# Patient Record
Sex: Female | Born: 1935
Health system: Southern US, Community
[De-identification: ages and names within clinical notes are randomized; demographics above are authoritative.]

---

## 2004-09-09 ENCOUNTER — Other Ambulatory Visit: Payer: Self-pay

## 2004-09-26 ENCOUNTER — Inpatient Hospital Stay: Payer: Self-pay | Admitting: Specialist

## 2011-09-14 ENCOUNTER — Emergency Department: Payer: Self-pay | Admitting: Internal Medicine

## 2011-09-16 ENCOUNTER — Inpatient Hospital Stay: Payer: Self-pay | Admitting: Internal Medicine

## 2012-11-28 ENCOUNTER — Inpatient Hospital Stay: Payer: Self-pay | Admitting: Internal Medicine

## 2012-11-28 LAB — COMPREHENSIVE METABOLIC PANEL
Albumin: 3.9 g/dL (ref 3.4–5.0)
Alkaline Phosphatase: 100 U/L (ref 50–136)
Anion Gap: 9 (ref 7–16)
BUN: 24 mg/dL — ABNORMAL HIGH (ref 7–18)
Bilirubin,Total: 0.9 mg/dL (ref 0.2–1.0)
Calcium, Total: 10.2 mg/dL — ABNORMAL HIGH (ref 8.5–10.1)
Creatinine: 1.18 mg/dL (ref 0.60–1.30)
EGFR (African American): 45 — ABNORMAL LOW
EGFR (Non-African Amer.): 39 — ABNORMAL LOW
Osmolality: 288 (ref 275–301)
Potassium: 4 mmol/L (ref 3.5–5.1)
SGOT(AST): 37 U/L (ref 15–37)
SGPT (ALT): 22 U/L (ref 12–78)
Sodium: 142 mmol/L (ref 136–145)
Total Protein: 7.4 g/dL (ref 6.4–8.2)

## 2012-11-28 LAB — CBC
HCT: 39.9 % (ref 35.0–47.0)
HGB: 13.5 g/dL (ref 12.0–16.0)
Platelet: 238 10*3/uL (ref 150–440)
RBC: 4.49 10*6/uL (ref 3.80–5.20)
RDW: 14.2 % (ref 11.5–14.5)
WBC: 10.4 10*3/uL (ref 3.6–11.0)

## 2012-11-28 LAB — URINALYSIS, COMPLETE
Bilirubin,UR: NEGATIVE
Blood: NEGATIVE
Specific Gravity: 1.013 (ref 1.003–1.030)
Transitional Epi: 10
WBC UR: 500 /HPF (ref 0–5)

## 2012-11-28 LAB — PROTIME-INR: INR: 1

## 2012-11-28 LAB — CK TOTAL AND CKMB (NOT AT ARMC)
CK, Total: 244 U/L — ABNORMAL HIGH (ref 21–215)
CK-MB: 4.6 ng/mL — ABNORMAL HIGH (ref 0.5–3.6)

## 2012-11-28 LAB — TROPONIN I: Troponin-I: <0.02 ng/mL

## 2012-11-28 LAB — APTT: Activated PTT: 30.2 secs (ref 23.6–35.9)

## 2012-11-29 LAB — BASIC METABOLIC PANEL WITH GFR
Anion Gap: 5 — ABNORMAL LOW (ref 7–16)
BUN: 24 mg/dL — ABNORMAL HIGH (ref 7–18)
Calcium, Total: 8.6 mg/dL (ref 8.5–10.1)
Chloride: 110 mmol/L — ABNORMAL HIGH (ref 98–107)
Co2: 28 mmol/L (ref 21–32)
Creatinine: 1.17 mg/dL (ref 0.60–1.30)
EGFR (African American): 46 — ABNORMAL LOW
EGFR (Non-African Amer.): 39 — ABNORMAL LOW
Glucose: 92 mg/dL (ref 65–99)
Osmolality: 289 (ref 275–301)
Potassium: 3.4 mmol/L — ABNORMAL LOW (ref 3.5–5.1)
Sodium: 143 mmol/L (ref 136–145)

## 2012-11-29 LAB — CBC WITH DIFFERENTIAL/PLATELET
Basophil #: 0 10*3/uL (ref 0.0–0.1)
Basophil %: 0.4 %
HCT: 33.6 % — ABNORMAL LOW (ref 35.0–47.0)
HGB: 11.9 g/dL — ABNORMAL LOW (ref 12.0–16.0)
Lymphocyte #: 0.6 10*3/uL — ABNORMAL LOW (ref 1.0–3.6)
Lymphocyte %: 5.9 %
MCH: 31.2 pg (ref 26.0–34.0)
MCHC: 35.3 g/dL (ref 32.0–36.0)
Monocyte #: 0.4 x10 3/mm (ref 0.2–0.9)
Neutrophil %: 87.8 %
Platelet: 204 10*3/uL (ref 150–440)
RBC: 3.8 10*6/uL (ref 3.80–5.20)
RDW: 14.1 % (ref 11.5–14.5)
WBC: 9.5 10*3/uL (ref 3.6–11.0)

## 2012-11-29 LAB — URINE CULTURE

## 2012-11-30 DIAGNOSIS — R9431 Abnormal electrocardiogram [ECG] [EKG]: Secondary | ICD-10-CM

## 2012-11-30 LAB — BASIC METABOLIC PANEL
BUN: 22 mg/dL — ABNORMAL HIGH (ref 7–18)
Chloride: 111 mmol/L — ABNORMAL HIGH (ref 98–107)
Creatinine: 1.06 mg/dL (ref 0.60–1.30)
EGFR (African American): 51 — ABNORMAL LOW
EGFR (Non-African Amer.): 44 — ABNORMAL LOW
Glucose: 78 mg/dL (ref 65–99)
Osmolality: 287 (ref 275–301)
Sodium: 143 mmol/L (ref 136–145)

## 2012-11-30 LAB — MAGNESIUM: Magnesium: 1.4 mg/dL — ABNORMAL LOW

## 2013-08-06 ENCOUNTER — Emergency Department: Payer: Self-pay | Admitting: Emergency Medicine

## 2013-08-06 LAB — COMPREHENSIVE METABOLIC PANEL
Albumin: 3.7 g/dL (ref 3.4–5.0)
Alkaline Phosphatase: 109 U/L (ref 50–136)
Bilirubin,Total: 0.8 mg/dL (ref 0.2–1.0)
Calcium, Total: 9.4 mg/dL (ref 8.5–10.1)
Chloride: 106 mmol/L (ref 98–107)
Co2: 31 mmol/L (ref 21–32)
Creatinine: 1.21 mg/dL (ref 0.60–1.30)
EGFR (African American): 43 — ABNORMAL LOW
EGFR (Non-African Amer.): 37 — ABNORMAL LOW
Osmolality: 292 (ref 275–301)
SGOT(AST): 27 U/L (ref 15–37)
SGPT (ALT): 18 U/L (ref 12–78)
Total Protein: 7.2 g/dL (ref 6.4–8.2)

## 2013-08-06 LAB — CBC
HCT: 40.1 % (ref 35.0–47.0)
HGB: 13.7 g/dL (ref 12.0–16.0)
MCH: 30.6 pg (ref 26.0–34.0)
RDW: 14.1 % (ref 11.5–14.5)

## 2013-08-06 LAB — URINALYSIS, COMPLETE
Bacteria: NONE SEEN
Bilirubin,UR: NEGATIVE
Blood: NEGATIVE
RBC,UR: 4 /HPF (ref 0–5)
Squamous Epithelial: NONE SEEN
WBC UR: 1 /HPF (ref 0–5)

## 2013-08-06 LAB — TROPONIN I: Troponin-I: <0.02 ng/mL

## 2013-08-07 LAB — URINE CULTURE

## 2013-08-14 ENCOUNTER — Observation Stay: Payer: Self-pay | Admitting: Internal Medicine

## 2013-08-14 LAB — CBC WITH DIFFERENTIAL/PLATELET
Basophil #: 0.1 10*3/uL (ref 0.0–0.1)
Basophil %: 1.2 %
Eosinophil #: 0.2 10*3/uL (ref 0.0–0.7)
HCT: 39.1 % (ref 35.0–47.0)
HGB: 13.2 g/dL (ref 12.0–16.0)
Lymphocyte #: 1.2 10*3/uL (ref 1.0–3.6)
MCH: 30.5 pg (ref 26.0–34.0)
MCHC: 33.9 g/dL (ref 32.0–36.0)
MCV: 90 fL (ref 80–100)
Monocyte #: 0.5 x10 3/mm (ref 0.2–0.9)
Monocyte %: 6 %
Platelet: 193 10*3/uL (ref 150–440)
RBC: 4.33 10*6/uL (ref 3.80–5.20)
RDW: 14 % (ref 11.5–14.5)

## 2013-08-14 LAB — COMPREHENSIVE METABOLIC PANEL
Albumin: 3.5 g/dL (ref 3.4–5.0)
Alkaline Phosphatase: 86 U/L
Anion Gap: 3 — ABNORMAL LOW (ref 7–16)
BUN: 28 mg/dL — ABNORMAL HIGH (ref 7–18)
Calcium, Total: 9.9 mg/dL (ref 8.5–10.1)
Co2: 33 mmol/L — ABNORMAL HIGH (ref 21–32)
Creatinine: 1.2 mg/dL (ref 0.60–1.30)
Glucose: 134 mg/dL — ABNORMAL HIGH (ref 65–99)
Osmolality: 285 (ref 275–301)
Potassium: 3.9 mmol/L (ref 3.5–5.1)
SGOT(AST): 21 U/L (ref 15–37)
Sodium: 139 mmol/L (ref 136–145)

## 2013-08-14 LAB — URINALYSIS, COMPLETE
Bilirubin,UR: NEGATIVE
Blood: NEGATIVE
Glucose,UR: NEGATIVE mg/dL (ref 0–75)
Leukocyte Esterase: NEGATIVE
Ph: 7 (ref 4.5–8.0)
Protein: NEGATIVE
RBC,UR: <1 /HPF (ref 0–5)
Specific Gravity: 1.011 (ref 1.003–1.030)

## 2013-08-14 LAB — TSH: Thyroid Stimulating Horm: 2.69 u[IU]/mL

## 2013-08-14 LAB — TROPONIN I: Troponin-I: <0.02 ng/mL

## 2013-08-15 LAB — LIPID PANEL
Cholesterol: 111 mg/dL (ref 0–200)
Triglycerides: 110 mg/dL (ref 0–200)
VLDL Cholesterol, Calc: 22 mg/dL (ref 5–40)

## 2013-09-10 LAB — BASIC METABOLIC PANEL
Anion Gap: 4 — ABNORMAL LOW (ref 7–16)
BUN: 23 mg/dL — ABNORMAL HIGH (ref 7–18)
Calcium, Total: 10.4 mg/dL — ABNORMAL HIGH (ref 8.5–10.1)
Co2: 33 mmol/L — ABNORMAL HIGH (ref 21–32)
Creatinine: 1.17 mg/dL (ref 0.60–1.30)
EGFR (Non-African Amer.): 39 — ABNORMAL LOW
Glucose: 149 mg/dL — ABNORMAL HIGH (ref 65–99)
Osmolality: 284 (ref 275–301)
Potassium: 4.2 mmol/L (ref 3.5–5.1)
Sodium: 139 mmol/L (ref 136–145)

## 2013-09-10 LAB — CBC WITH DIFFERENTIAL/PLATELET
Basophil #: 0.1 10*3/uL (ref 0.0–0.1)
Basophil %: 0.9 %
Eosinophil %: 2.4 %
HCT: 42.6 % (ref 35.0–47.0)
HGB: 14.1 g/dL (ref 12.0–16.0)
Lymphocyte #: 1.4 10*3/uL (ref 1.0–3.6)
Lymphocyte %: 15.9 %
MCH: 30.2 pg (ref 26.0–34.0)
MCHC: 33.2 g/dL (ref 32.0–36.0)
Neutrophil %: 74 %
Platelet: 257 10*3/uL (ref 150–440)
RDW: 14 % (ref 11.5–14.5)
WBC: 8.7 10*3/uL (ref 3.6–11.0)

## 2013-09-10 LAB — TROPONIN I: Troponin-I: 0.03 ng/mL

## 2013-09-10 LAB — URINALYSIS, COMPLETE
Bacteria: NONE SEEN
Blood: NEGATIVE
Glucose,UR: NEGATIVE mg/dL (ref 0–75)
Ketone: NEGATIVE
Nitrite: NEGATIVE
Protein: NEGATIVE

## 2013-09-11 ENCOUNTER — Observation Stay: Payer: Self-pay | Admitting: Internal Medicine

## 2013-09-12 LAB — CBC WITH DIFFERENTIAL/PLATELET
Basophil #: 0.1 10*3/uL (ref 0.0–0.1)
Basophil %: 0.7 %
Eosinophil #: 0.3 10*3/uL (ref 0.0–0.7)
Eosinophil %: 3.6 %
HCT: 37 % (ref 35.0–47.0)
Lymphocyte #: 0.9 10*3/uL — ABNORMAL LOW (ref 1.0–3.6)
MCH: 29.2 pg (ref 26.0–34.0)
MCHC: 32 g/dL (ref 32.0–36.0)
MCV: 91 fL (ref 80–100)
Monocyte %: 3.6 %
Platelet: 208 10*3/uL (ref 150–440)
RBC: 4.06 10*6/uL (ref 3.80–5.20)
WBC: 7.6 10*3/uL (ref 3.6–11.0)

## 2013-09-12 LAB — BASIC METABOLIC PANEL
Anion Gap: 5 — ABNORMAL LOW (ref 7–16)
Creatinine: 1.11 mg/dL (ref 0.60–1.30)
EGFR (Non-African Amer.): 42 — ABNORMAL LOW
Osmolality: 288 (ref 275–301)
Sodium: 143 mmol/L (ref 136–145)

## 2013-09-22 ENCOUNTER — Ambulatory Visit: Payer: Self-pay | Admitting: Internal Medicine

## 2013-09-22 ENCOUNTER — Emergency Department: Payer: Self-pay | Admitting: Emergency Medicine

## 2013-09-22 LAB — URINALYSIS, COMPLETE
BILIRUBIN, UR: NEGATIVE
BLOOD: NEGATIVE
Glucose,UR: NEGATIVE mg/dL (ref 0–75)
Hyaline Cast: 10
KETONE: NEGATIVE
Nitrite: NEGATIVE
PH: 6 (ref 4.5–8.0)
PROTEIN: NEGATIVE
RBC,UR: 1 /HPF (ref 0–5)
SPECIFIC GRAVITY: 1.015 (ref 1.003–1.030)
Squamous Epithelial: NONE SEEN
WBC UR: 18 /HPF (ref 0–5)

## 2013-09-22 LAB — CBC
HCT: 40.6 % (ref 35.0–47.0)
HGB: 13.7 g/dL (ref 12.0–16.0)
MCH: 30.5 pg (ref 26.0–34.0)
MCHC: 33.8 g/dL (ref 32.0–36.0)
MCV: 90 fL (ref 80–100)
Platelet: 238 10*3/uL (ref 150–440)
RBC: 4.5 10*6/uL (ref 3.80–5.20)
RDW: 13.5 % (ref 11.5–14.5)
WBC: 10.9 10*3/uL (ref 3.6–11.0)

## 2013-09-22 LAB — COMPREHENSIVE METABOLIC PANEL
Albumin: 3.5 g/dL (ref 3.4–5.0)
Alkaline Phosphatase: 89 U/L
Anion Gap: 2 — ABNORMAL LOW (ref 7–16)
BILIRUBIN TOTAL: 0.7 mg/dL (ref 0.2–1.0)
BUN: 16 mg/dL (ref 7–18)
Calcium, Total: 9.4 mg/dL (ref 8.5–10.1)
Chloride: 104 mmol/L (ref 98–107)
Co2: 31 mmol/L (ref 21–32)
Creatinine: 1.06 mg/dL (ref 0.60–1.30)
EGFR (African American): 51 — ABNORMAL LOW
GFR CALC NON AF AMER: 44 — AB
Glucose: 97 mg/dL (ref 65–99)
Osmolality: 275 (ref 275–301)
Potassium: 3.8 mmol/L (ref 3.5–5.1)
SGOT(AST): 26 U/L (ref 15–37)
SGPT (ALT): 20 U/L (ref 12–78)
SODIUM: 137 mmol/L (ref 136–145)
TOTAL PROTEIN: 7.1 g/dL (ref 6.4–8.2)

## 2013-09-24 LAB — URINE CULTURE

## 2013-09-27 ENCOUNTER — Inpatient Hospital Stay: Payer: Self-pay | Admitting: Internal Medicine

## 2013-09-27 LAB — CBC WITH DIFFERENTIAL/PLATELET
Basophil #: 0 10*3/uL (ref 0.0–0.1)
Basophil %: 0.3 %
Eosinophil #: 0.2 10*3/uL (ref 0.0–0.7)
Eosinophil %: 1.3 %
HCT: 39.6 % (ref 35.0–47.0)
HGB: 13.3 g/dL (ref 12.0–16.0)
LYMPHS ABS: 0.5 10*3/uL — AB (ref 1.0–3.6)
LYMPHS PCT: 4 %
MCH: 30.2 pg (ref 26.0–34.0)
MCHC: 33.7 g/dL (ref 32.0–36.0)
MCV: 90 fL (ref 80–100)
Monocyte #: 0.7 x10 3/mm (ref 0.2–0.9)
Monocyte %: 5.8 %
Neutrophil #: 10.5 10*3/uL — ABNORMAL HIGH (ref 1.4–6.5)
Neutrophil %: 88.6 %
Platelet: 220 10*3/uL (ref 150–440)
RBC: 4.42 10*6/uL (ref 3.80–5.20)
RDW: 13.5 % (ref 11.5–14.5)
WBC: 11.8 10*3/uL — ABNORMAL HIGH (ref 3.6–11.0)

## 2013-09-27 LAB — COMPREHENSIVE METABOLIC PANEL
ALT: 28 U/L (ref 12–78)
Albumin: 3.3 g/dL — ABNORMAL LOW (ref 3.4–5.0)
Alkaline Phosphatase: 106 U/L
Anion Gap: 5 — ABNORMAL LOW (ref 7–16)
BUN: 21 mg/dL — AB (ref 7–18)
Bilirubin,Total: 1 mg/dL (ref 0.2–1.0)
CO2: 27 mmol/L (ref 21–32)
Calcium, Total: 9.3 mg/dL (ref 8.5–10.1)
Chloride: 104 mmol/L (ref 98–107)
Creatinine: 1.31 mg/dL — ABNORMAL HIGH (ref 0.60–1.30)
EGFR (African American): 39 — ABNORMAL LOW
EGFR (Non-African Amer.): 34 — ABNORMAL LOW
GLUCOSE: 117 mg/dL — AB (ref 65–99)
Osmolality: 276 (ref 275–301)
Potassium: 4.1 mmol/L (ref 3.5–5.1)
SGOT(AST): 65 U/L — ABNORMAL HIGH (ref 15–37)
SODIUM: 136 mmol/L (ref 136–145)
Total Protein: 6.6 g/dL (ref 6.4–8.2)

## 2013-09-27 LAB — URINALYSIS, COMPLETE
BILIRUBIN, UR: NEGATIVE
Bacteria: NONE SEEN
Glucose,UR: NEGATIVE mg/dL (ref 0–75)
NITRITE: NEGATIVE
PROTEIN: NEGATIVE
Ph: 6 (ref 4.5–8.0)
SPECIFIC GRAVITY: 1.01 (ref 1.003–1.030)

## 2013-09-27 LAB — MAGNESIUM: Magnesium: 1.7 mg/dL — ABNORMAL LOW

## 2013-09-27 LAB — TROPONIN I: Troponin-I: <0.02 ng/mL

## 2013-09-27 LAB — LIPASE, BLOOD: Lipase: 79 U/L (ref 73–393)

## 2013-09-28 LAB — CBC WITH DIFFERENTIAL/PLATELET
BASOS PCT: 1.2 %
Basophil #: 0.1 10*3/uL (ref 0.0–0.1)
EOS ABS: 0.4 10*3/uL (ref 0.0–0.7)
EOS PCT: 4.7 %
HCT: 33.8 % — ABNORMAL LOW (ref 35.0–47.0)
HGB: 11.7 g/dL — ABNORMAL LOW (ref 12.0–16.0)
Lymphocyte #: 0.6 10*3/uL — ABNORMAL LOW (ref 1.0–3.6)
Lymphocyte %: 7.4 %
MCH: 31 pg (ref 26.0–34.0)
MCHC: 34.6 g/dL (ref 32.0–36.0)
MCV: 90 fL (ref 80–100)
MONO ABS: 0.3 x10 3/mm (ref 0.2–0.9)
MONOS PCT: 3.5 %
Neutrophil #: 7.2 10*3/uL — ABNORMAL HIGH (ref 1.4–6.5)
Neutrophil %: 83.2 %
Platelet: 232 10*3/uL (ref 150–440)
RBC: 3.77 10*6/uL — AB (ref 3.80–5.20)
RDW: 13.7 % (ref 11.5–14.5)
WBC: 8.7 10*3/uL (ref 3.6–11.0)

## 2013-09-28 LAB — BASIC METABOLIC PANEL
Anion Gap: 6 — ABNORMAL LOW (ref 7–16)
BUN: 15 mg/dL (ref 7–18)
CHLORIDE: 110 mmol/L — AB (ref 98–107)
CREATININE: 1.17 mg/dL (ref 0.60–1.30)
Calcium, Total: 8.8 mg/dL (ref 8.5–10.1)
Co2: 26 mmol/L (ref 21–32)
GFR CALC AF AMER: 45 — AB
GFR CALC NON AF AMER: 39 — AB
Glucose: 85 mg/dL (ref 65–99)
OSMOLALITY: 283 (ref 275–301)
Potassium: 3.6 mmol/L (ref 3.5–5.1)
Sodium: 142 mmol/L (ref 136–145)

## 2013-09-28 LAB — MAGNESIUM: Magnesium: 1.7 mg/dL — ABNORMAL LOW

## 2013-09-29 LAB — URINE CULTURE

## 2013-09-30 LAB — BASIC METABOLIC PANEL
Anion Gap: 7 (ref 7–16)
BUN: 12 mg/dL (ref 7–18)
CALCIUM: 8.2 mg/dL — AB (ref 8.5–10.1)
CHLORIDE: 108 mmol/L — AB (ref 98–107)
CREATININE: 1.15 mg/dL (ref 0.60–1.30)
Co2: 26 mmol/L (ref 21–32)
EGFR (African American): 46 — ABNORMAL LOW
GFR CALC NON AF AMER: 40 — AB
Glucose: 79 mg/dL (ref 65–99)
OSMOLALITY: 280 (ref 275–301)
POTASSIUM: 3.3 mmol/L — AB (ref 3.5–5.1)
SODIUM: 141 mmol/L (ref 136–145)

## 2013-09-30 LAB — MAGNESIUM: Magnesium: 1.4 mg/dL — ABNORMAL LOW

## 2013-10-01 ENCOUNTER — Ambulatory Visit: Payer: Self-pay | Admitting: Neurology

## 2013-10-01 LAB — BASIC METABOLIC PANEL
Anion Gap: 8 (ref 7–16)
BUN: 10 mg/dL (ref 7–18)
CO2: 26 mmol/L (ref 21–32)
Calcium, Total: 8.6 mg/dL (ref 8.5–10.1)
Chloride: 107 mmol/L (ref 98–107)
Creatinine: 1.02 mg/dL (ref 0.60–1.30)
EGFR (Non-African Amer.): 46 — ABNORMAL LOW
GFR CALC AF AMER: 53 — AB
GLUCOSE: 88 mg/dL (ref 65–99)
Osmolality: 280 (ref 275–301)
Potassium: 3.2 mmol/L — ABNORMAL LOW (ref 3.5–5.1)
Sodium: 141 mmol/L (ref 136–145)

## 2013-10-01 LAB — PLATELET COUNT: Platelet: 256 10*3/uL (ref 150–440)

## 2013-10-02 LAB — CULTURE, BLOOD (SINGLE)

## 2013-10-05 ENCOUNTER — Emergency Department: Payer: Self-pay | Admitting: Emergency Medicine

## 2013-10-05 LAB — CBC
HCT: 32.5 % — ABNORMAL LOW (ref 35.0–47.0)
HGB: 11 g/dL — AB (ref 12.0–16.0)
MCH: 30.3 pg (ref 26.0–34.0)
MCHC: 34 g/dL (ref 32.0–36.0)
MCV: 89 fL (ref 80–100)
Platelet: 221 10*3/uL (ref 150–440)
RBC: 3.65 10*6/uL — ABNORMAL LOW (ref 3.80–5.20)
RDW: 13.6 % (ref 11.5–14.5)
WBC: 11.6 10*3/uL — ABNORMAL HIGH (ref 3.6–11.0)

## 2013-10-05 LAB — URINALYSIS, COMPLETE
BILIRUBIN, UR: NEGATIVE
Bacteria: NONE SEEN
Blood: NEGATIVE
Glucose,UR: NEGATIVE mg/dL (ref 0–75)
Ketone: NEGATIVE
Nitrite: NEGATIVE
Ph: 6 (ref 4.5–8.0)
Protein: NEGATIVE
RBC,UR: 3 /HPF (ref 0–5)
Specific Gravity: 1.014 (ref 1.003–1.030)
Squamous Epithelial: NONE SEEN
WBC UR: 9 /HPF (ref 0–5)

## 2013-10-05 LAB — COMPREHENSIVE METABOLIC PANEL
Albumin: 2.6 g/dL — ABNORMAL LOW (ref 3.4–5.0)
Alkaline Phosphatase: 79 U/L
Anion Gap: 4 — ABNORMAL LOW (ref 7–16)
BUN: 19 mg/dL — ABNORMAL HIGH (ref 7–18)
Bilirubin,Total: 0.5 mg/dL (ref 0.2–1.0)
CHLORIDE: 106 mmol/L (ref 98–107)
Calcium, Total: 8.5 mg/dL (ref 8.5–10.1)
Co2: 29 mmol/L (ref 21–32)
Creatinine: 1.07 mg/dL (ref 0.60–1.30)
EGFR (African American): 50 — ABNORMAL LOW
EGFR (Non-African Amer.): 43 — ABNORMAL LOW
Glucose: 119 mg/dL — ABNORMAL HIGH (ref 65–99)
Osmolality: 281 (ref 275–301)
POTASSIUM: 3.3 mmol/L — AB (ref 3.5–5.1)
SGOT(AST): 27 U/L (ref 15–37)
SGPT (ALT): 18 U/L (ref 12–78)
Sodium: 139 mmol/L (ref 136–145)
Total Protein: 5.9 g/dL — ABNORMAL LOW (ref 6.4–8.2)

## 2013-10-05 LAB — TROPONIN I: Troponin-I: <0.02 ng/mL

## 2013-10-23 ENCOUNTER — Ambulatory Visit: Payer: Self-pay | Admitting: Internal Medicine

## 2013-10-23 DEATH — deceased

## 2014-01-01 IMAGING — CT CT HEAD WITHOUT CONTRAST
2 of 4 series · 15 of 30 positions shown, 17 images · non-contrast
Comparison: 08/14/2013 and earlier studies

CLINICAL DATA: Confusion

EXAM:
CT HEAD WITHOUT CONTRAST
TECHNIQUE: Contiguous axial images were obtained from the base of the skull
through the vertex without intravenous contrast.

[Series 2: soft tissue · axial · 0.50mm/px · z∈[-139,-24]mm · 7 of 31 slices shown]
[im 4/31  brain]
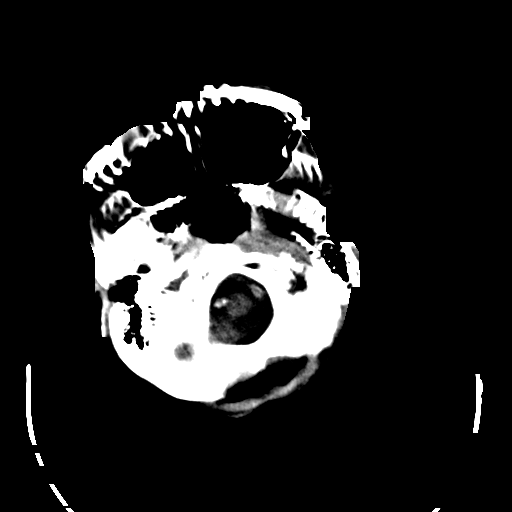
[im 8/31  brain]
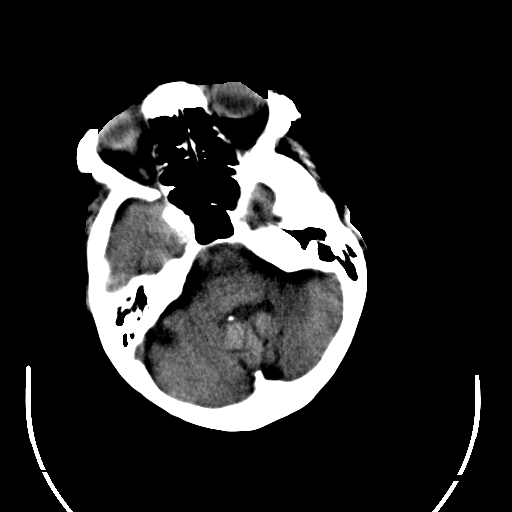
[im 12/31  brain]
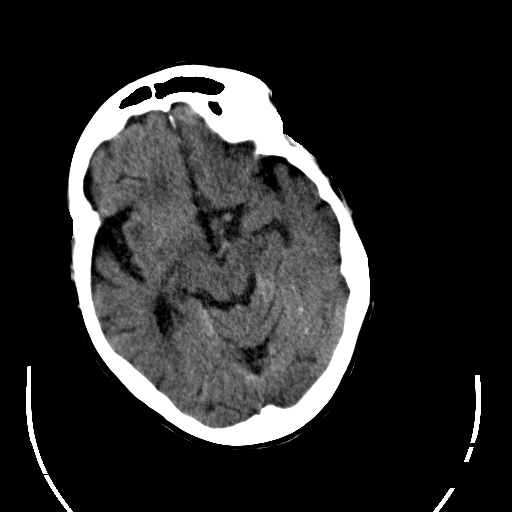
[im 16/31  brain]
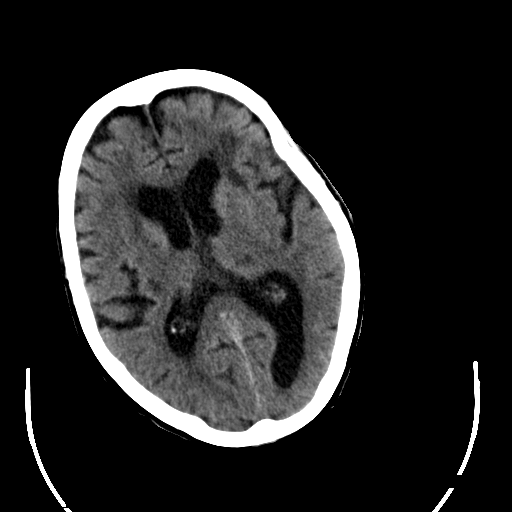
[im 19/31  brain]
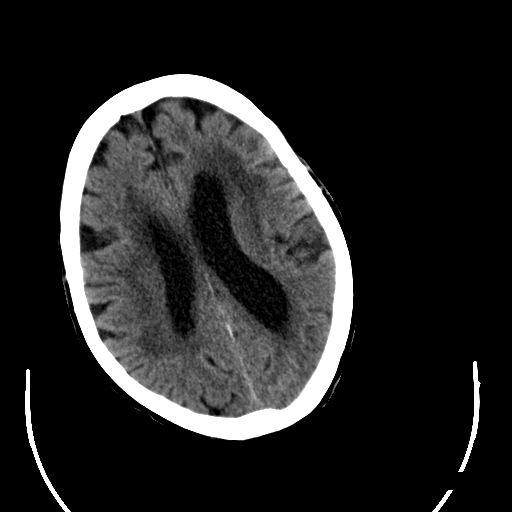
[im 23/31  brain]
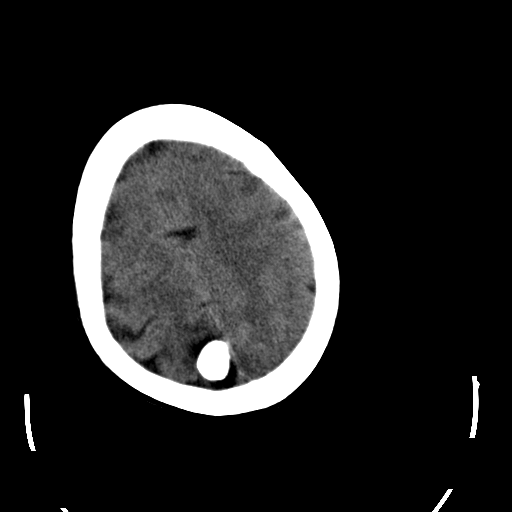
[im 27/31  brain]
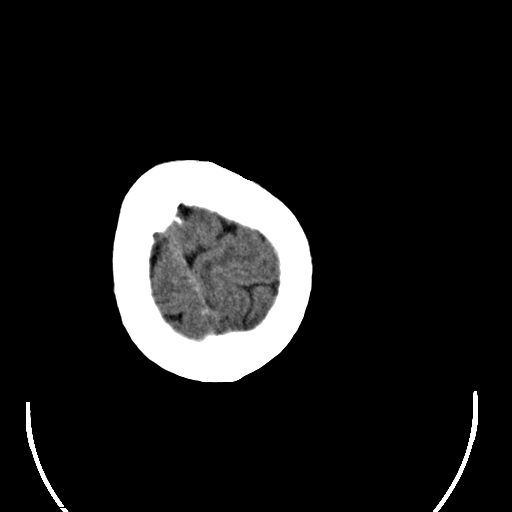

[Series 8: soft tissue recon · axial · 0.46mm/px · z∈[-153,-39]mm · 8 of 31 slices shown, 10 images]
[im 4/31  brain]
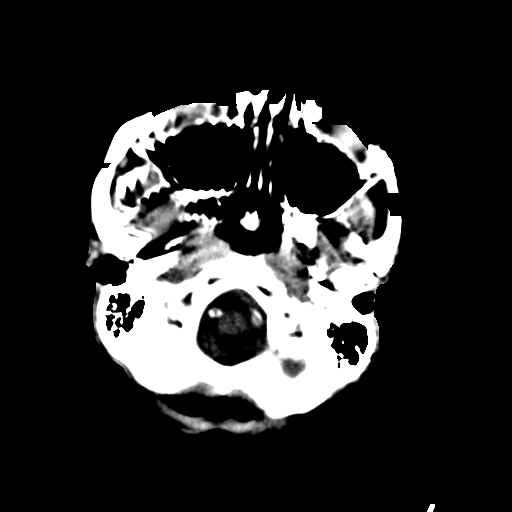
[im 4/31  bone]
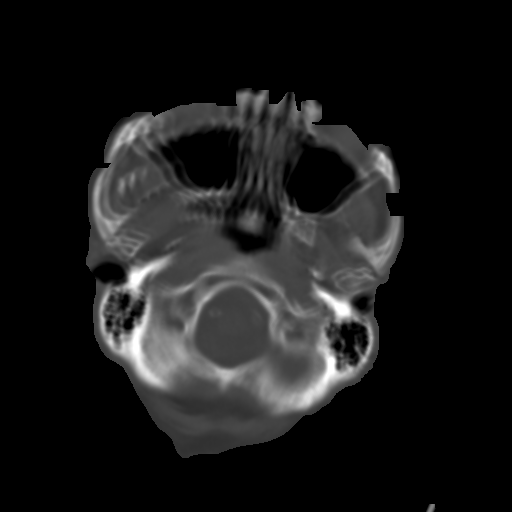
[im 7/31  brain]
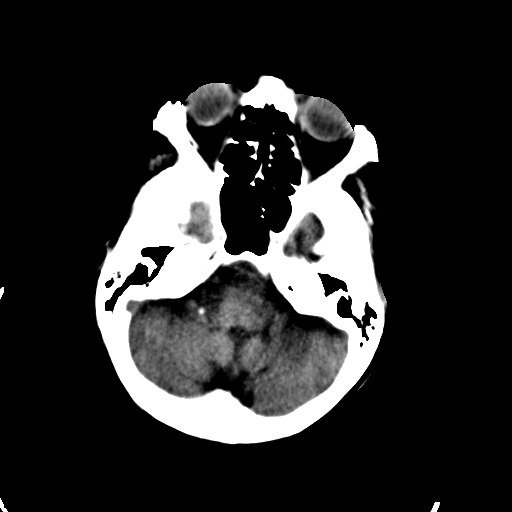
[im 11/31  brain]
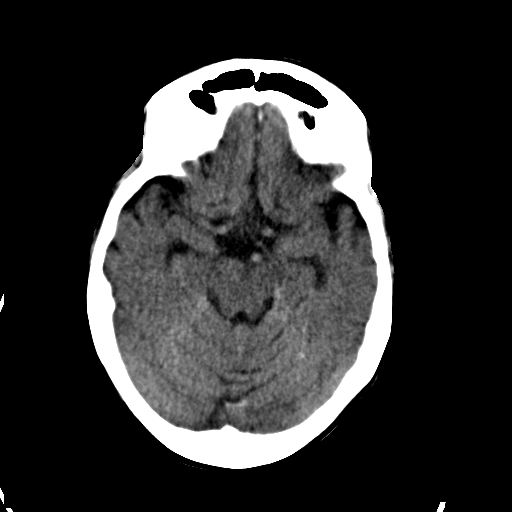
[im 14/31  brain]
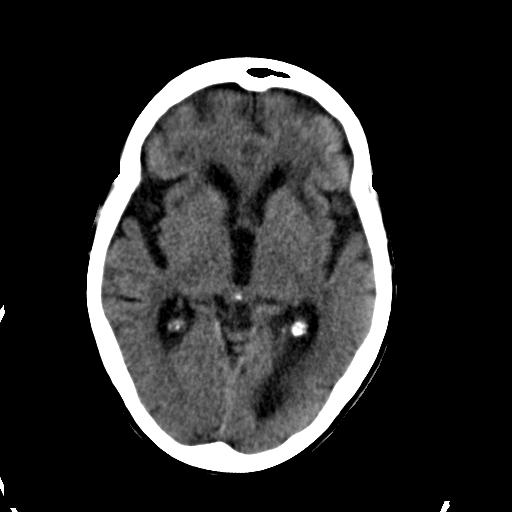
[im 17/31  brain]
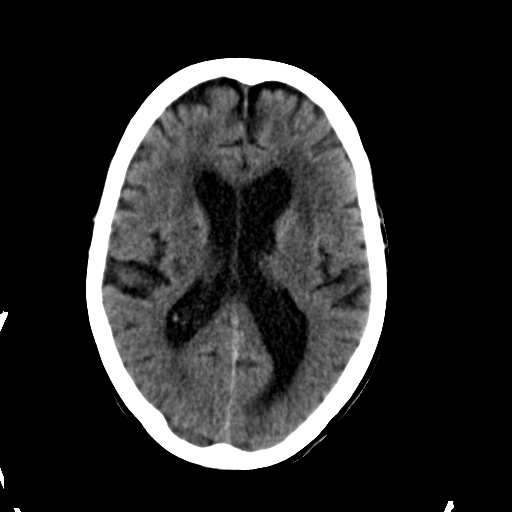
[im 17/31  bone]
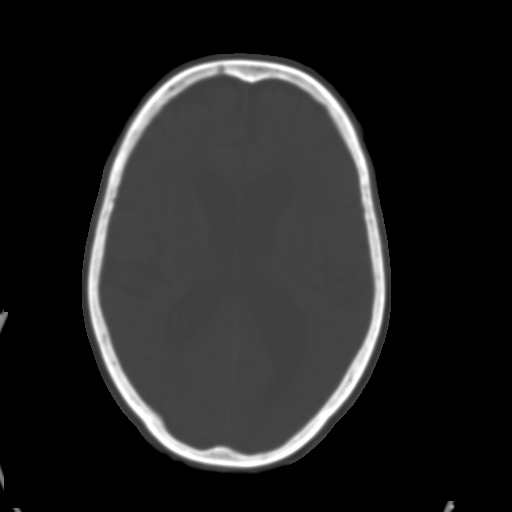
[im 21/31  brain]
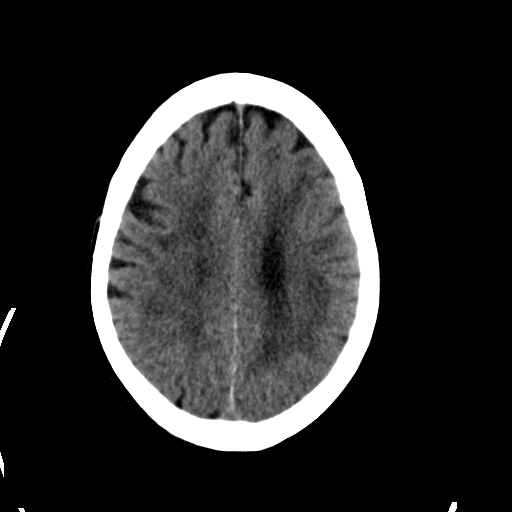
[im 24/31  brain]
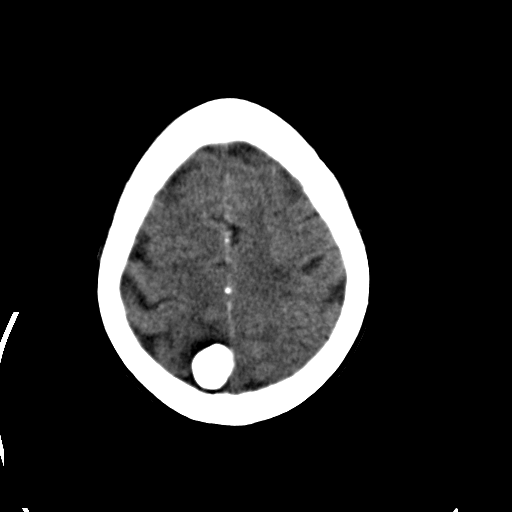
[im 27/31  brain]
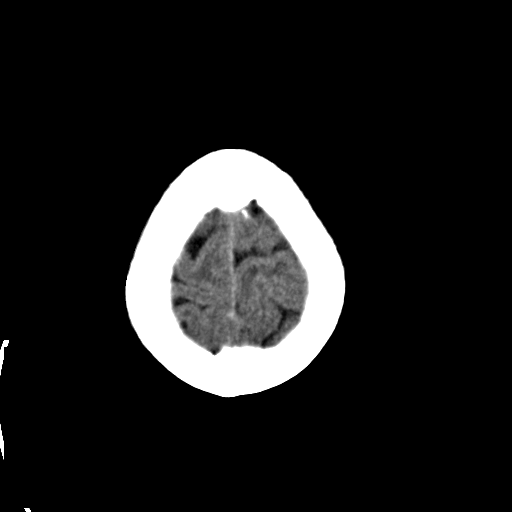

[15 of 30 positions shown; findings below may reference images not displayed]

FINDINGS: Stable 2 cm calcified right posterior parietal extra-axial mass. No
significant adjacent parenchymal edema. Atherosclerotic and
physiologic intracranial calcifications. Diffuse parenchymal
atrophy. Patchy areas of hypoattenuation in deep and periventricular
white matter bilaterally. Negative for acute intracranial
hemorrhage, intra-axial mass lesion, acute infarction, midline
shift, or mass-effect. Acute infarct may be inapparent on
noncontrast CT. Ventricles and sulci symmetric. Bone windows
demonstrate no acute lesion.
IMPRESSION: 1. Negative for bleed or other acute intracranial process.
2. Atrophy and bilateral extensive nonspecific white matter changes
as before.
3. Probable right posterior parietal meningioma, stable.

## 2014-01-17 IMAGING — CR DG CHEST 1V PORT
1 series · 1 of 1 positions shown · non-contrast
Comparison: PA and lateral chest 08/14/2013 and single view of the
chest 08/06/2013.

CLINICAL DATA: Cough.  Altered mental status.

EXAM:
PORTABLE CHEST - 1 VIEW

[ap]
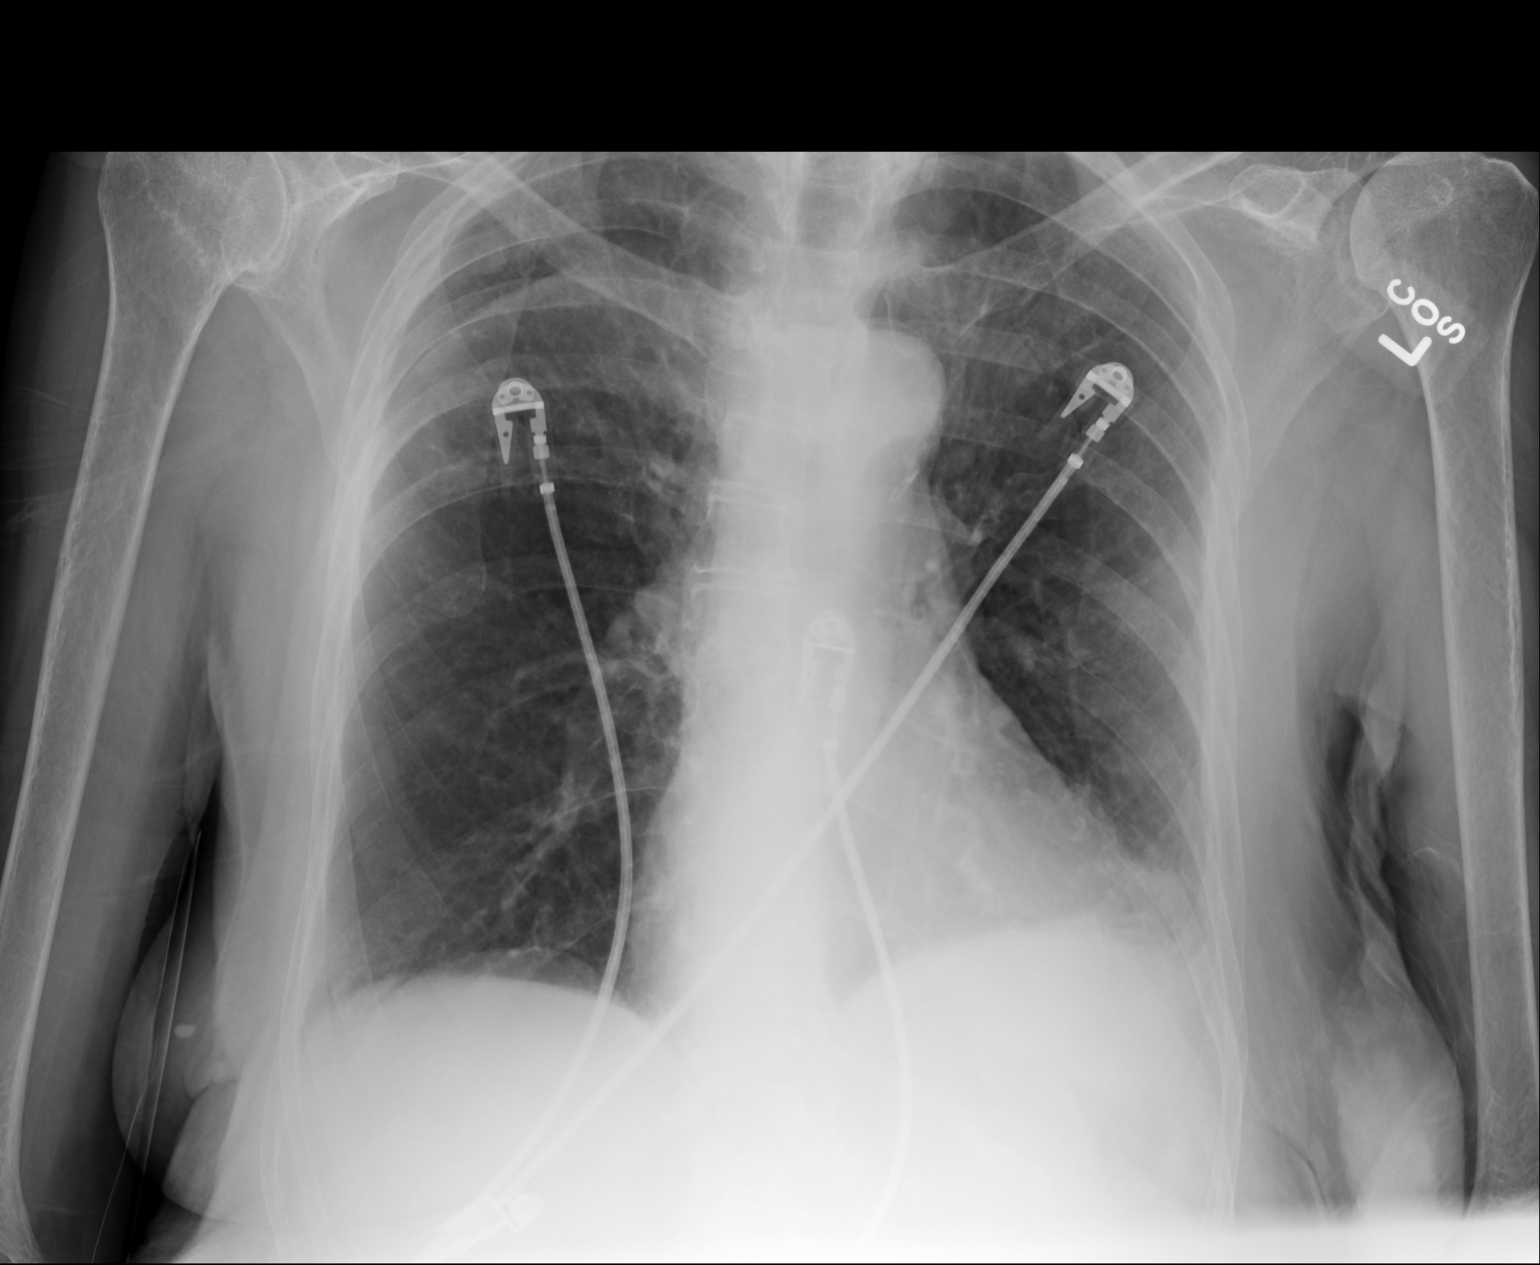

[1 of 1 positions shown; findings below may reference images not displayed]

FINDINGS: Patchy left basilar airspace disease is identified. The right lung
is clear. Heart size is normal. No pneumothorax or pleural fluid.
IMPRESSION: Mild, patchy left basilar airspace disease is likely due to
atelectasis rather than pneumonia.

## 2014-01-18 IMAGING — CR DG CHEST 2V
1 series · 4 of 4 positions shown · non-contrast
Comparison: 09/27/2013

CLINICAL DATA: Confusion and weakness.

EXAM:
CHEST  2 VIEW

[Series 1: pa · 0.17mm/px · 4 of 4 slices shown]
[im 1/4]
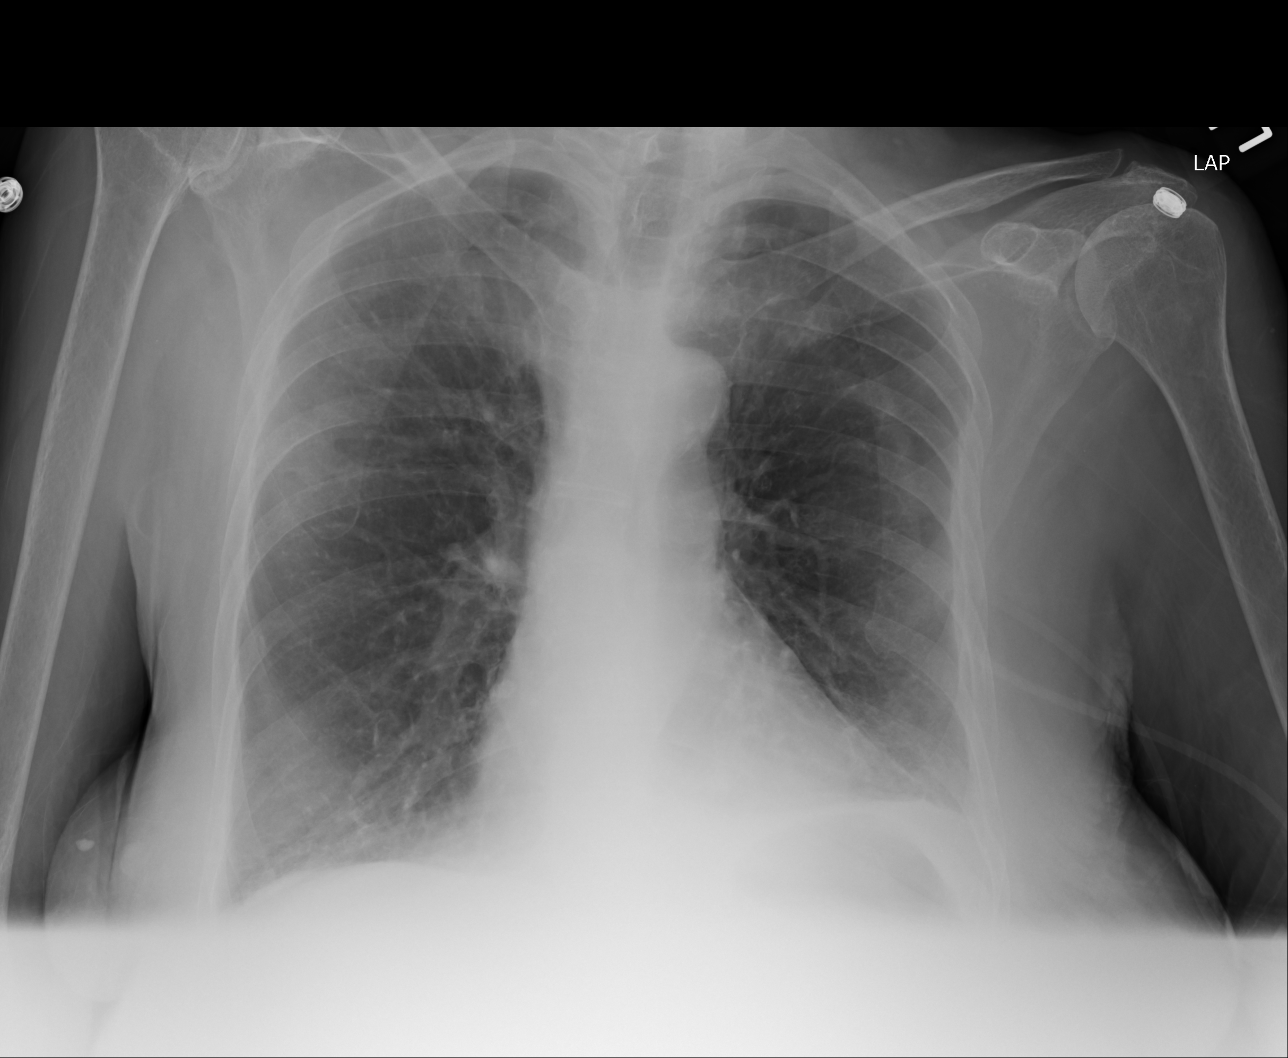
[im 2/4]
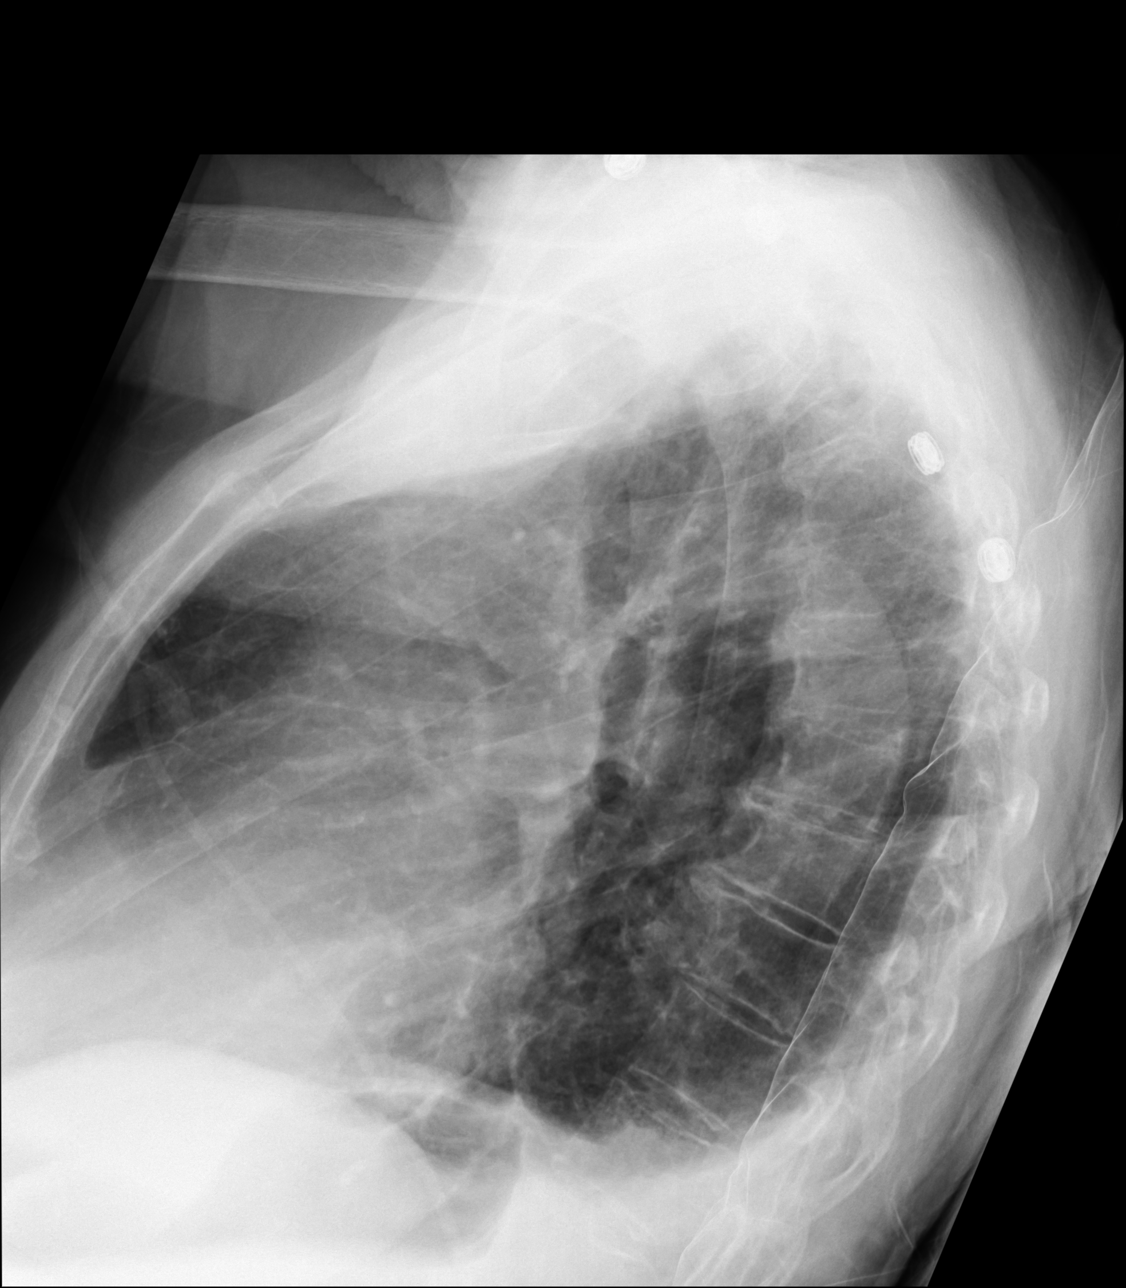
[im 3/4]
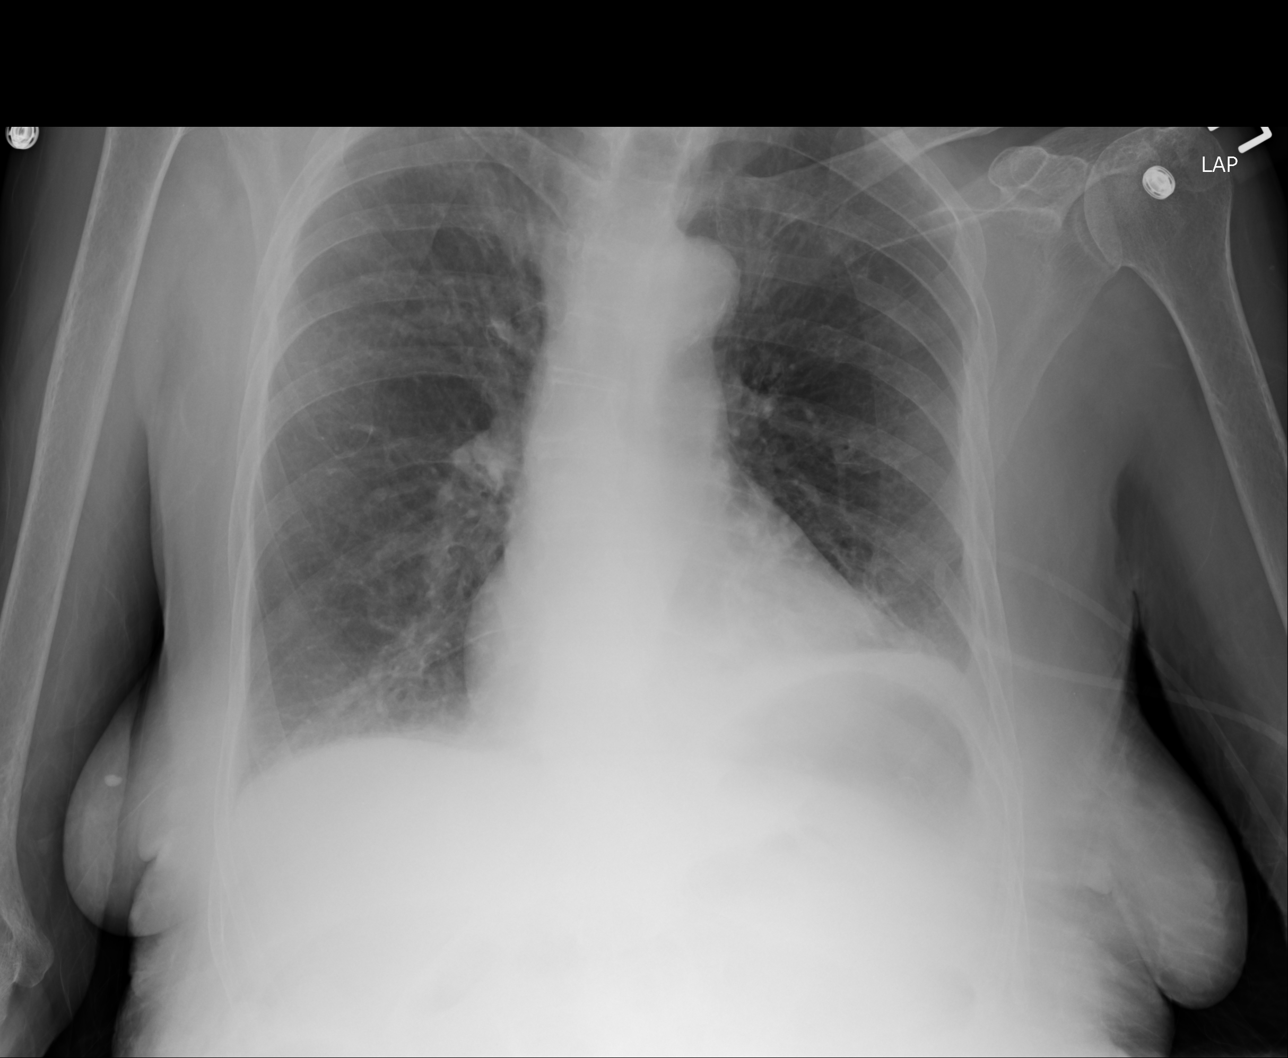
[im 4/4]
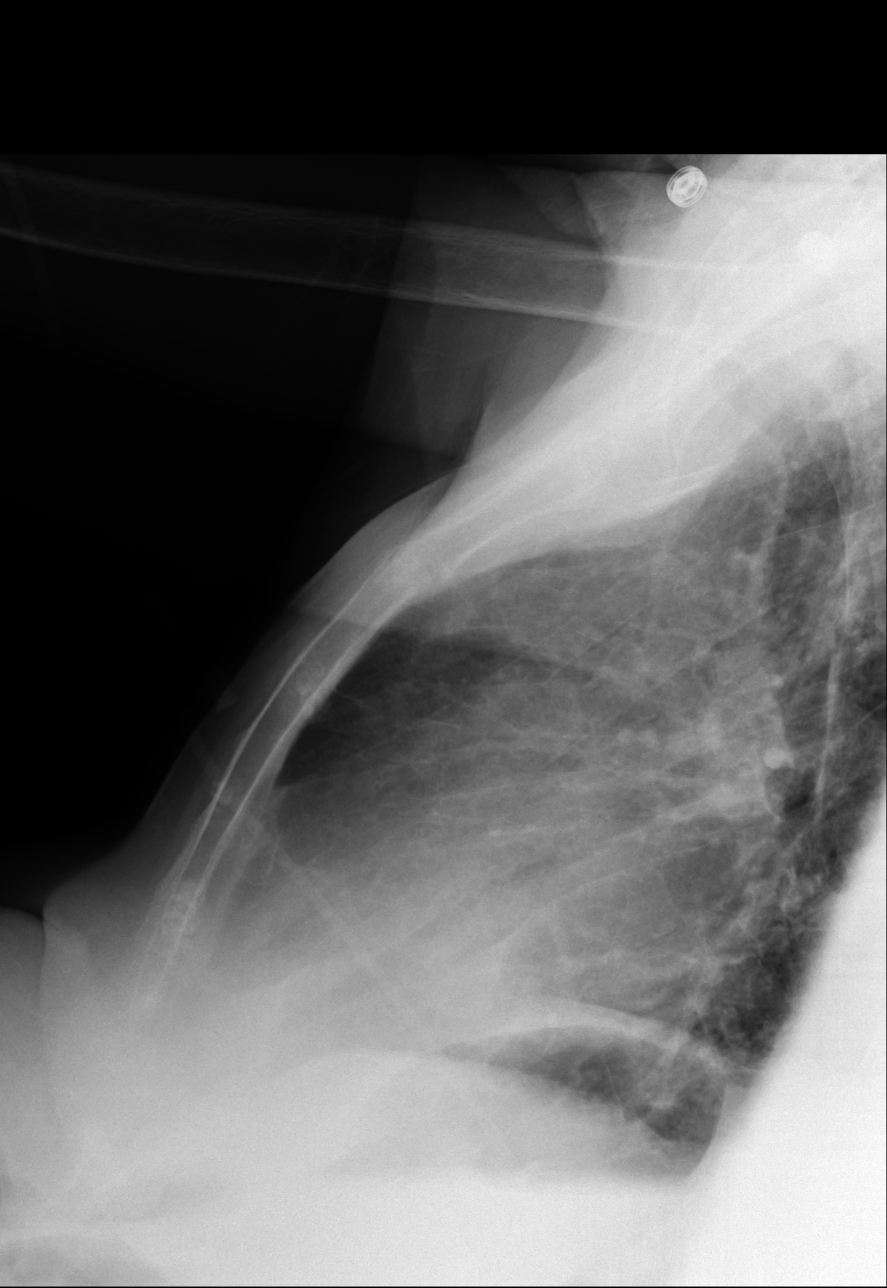

[4 of 4 positions shown; findings below may reference images not displayed]

FINDINGS: Two views of the chest demonstrates mild elevation of the left
hemidiaphragm. Evidence for small pleural effusions on the lateral
view. No definite airspace disease. Heart and mediastinum are within
normal limits.
IMPRESSION: Densities at the lung bases are suggestive for atelectasis and small
effusions.

## 2015-01-12 NOTE — H&P (Signed)
PATIENT NAME:  Debbie House, Mescal M MR#:  [id number] DATE OF BIRTH:  Jul 08, 1914  DATE OF ADMISSION:  11/28/2012  PRIMARY CARE PHYSICIAN: Katherina Rightenny C. Arlana Pouchate.  CHIEF COMPLAINT:  Brought in by daughter secondary to confusion.   HISTORY OF PRESENT ILLNESS:  This is a 81 year old female who lives by herself, usually walks with a walker. She had a fall on Friday and is complaining of left-sided pain with movement, 10 out of 10 in intensity when she is moving. When she lies still no pain. She was reaching in the closet and had a fall. She has been taking Tylenol for the pain. Since Friday she has had some confusion, sort of staring into space. This morning when the daughter came she has been making noises and was talking out of character and unable to get up. Yesterday she had difficulty with walking with a walker. In the ER she was found to have a positive urinalysis. CT scan of the head was negative, and the hospitalist services were contacted for further evaluation.   PAST MEDICAL HISTORY:  Hypertension, mild depression, history of meningioma, chronic low back pain, paroxysmal atrial fibrillation.   PAST SURGICAL HISTORY:  Left knee replacement, varicose vein surgery, and cataracts.   ALLERGIES:  TO SULFA.   MEDICATIONS:  As per Prescription Writer, include aspirin 81 mg 2 tablets daily, Norvasc  5 mg daily, Zoloft 25 mg on Monday, Wednesday and Friday, tramadol 50 mg every 6 hours as needed for pain.   SOCIAL HISTORY:  Lives home by herself. The daughter, who lives in [place], is currently at the bedside. She does have a son that lives close by. No smoking or alcohol. Was a homemaker in the past, and did do cafeteria work in the past also.   FAMILY HISTORY:  Father died at 5746 of TB. Mother died at 289 of old age.   REVIEW OF SYSTEMS:  CONSTITUTIONAL: No fever, chills, or sweats. No weight loss. No weight gain. No weakness or fatigue.   EYES: She does wear glasses and has glassy eyes over the past  couple of days.  EARS, NOSE, MOUTH, AND THROAT: Decreased hearing. Positive for postnasal drip. No sore throat. No difficulty swallowing.  CARDIOVASCULAR: No chest pain. No palpitations.  RESPIRATORY: No shortness of breath. Positive for cough, occasional phlegm, clear. No hemoptysis.  GASTROINTESTINAL: No nausea. No vomiting. No abdominal pain. Positive for constipation. No bright red blood per rectum. No melena.  GENITOURINARY: No burning on urination. No hematuria.  MUSCULOSKELETAL: Positive for left rib pain and chronic low back pain.  INTEGUMENT: No rashes or eruptions.  NEUROLOGIC: Unclear if she had any fainting episode, but has had altered mental status.  PSYCHIATRIC: On medication for depression.  ENDOCRINE: No thyroid problems.  HEMATOLOGIC/LYMPHATIC: No anemia.   PHYSICAL EXAM: VITAL SIGNS: Temperature 98.1, pulse 66, respirations 20, blood pressure 134/65, pulse ox 96% on room air.  GENERAL: No respiratory distress, lying flat in bed.  EYES: Conjunctivae and lids normal. Pupils equal, round, and reactive to light. Extraocular muscles intact. No nystagmus.  EARS, NOSE, MOUTH, AND THROAT: Nasal mucosa, no erythema. Throat: No erythema, no exudate. Teeth, lips and gums without lesions.  NECK: No JVD. No bruits. No lymphadenopathy. No thyromegaly. No thyroid nodules palpated.  RESPIRATORY: Lungs clear to auscultation. No use of accessory muscles to breathe. No rhonchi, rales, or wheeze heard.  CARDIOVASCULAR SYSTEM: S1, S2 irregularly irregular; 2/6 systolic ejection murmur. Carotid upstrokes 2+ bilaterally, no bruits. Dorsalis pedis pulses 1+ bilaterally; 2+  edema bilateral lower extremities.  ABDOMEN: Soft, nontender. No organosplenomegaly. Normoactive bowel sounds. No masses felt.  CHEST WALL: Positive for pain over the left ribs.  LYMPHATIC: No lymph nodes in the neck.  MUSCULOSKELETAL: 2+ edema. No clubbing. No cyanosis.  SKIN: Chronic lower extremity discoloration, and scaling,  and chronic erythema.   NEUROLOGIC: Cranial nerves II-XII grossly intact except for decreased hearing. Deep tendon reflexes 1+, bilateral lower extremities. The patient is stiff on the lower extremities. The patient is able to straight leg raise bilaterally, and able to lift arms up over the head. Power 4/5, upper and lower extremities. Babinski negative.  PSYCHIATRIC: The patient is oriented to person and place. The patient does have some difficulty processing some questions asked, but does answer some questions appropriately.   LABORATORY AND RADIOLOGICAL DATA: CT scan of the head showed changes of atrophy, chronic microvascular ischemic disease, right parietal meningioma. White blood cell count 10.4, H and H 13.5 and 39.9, platelet count of 238. Glucose 110, BUN 24, creatinine 1.18, sodium 142, potassium 4.0, chloride 105, CO2 28, calcium 10.2. Liver function tests normal. GFR 39.   EKG: Atrial fibrillation, 87 beats per minute.   ASSESSMENT AND PLAN: 1.  Encephalopathy, likely due to urinary tract infection: Will give antibiotics and follow up cultures, and continue to monitor mental status.  2.  Acute renal failure: GFR down to 39, previously GFR was greater than 60. Will give IV fluid hydration gently and recheck a creatinine in the a.m.  3.  Urinary tract infection: Will send off urine culture. Rocephin given in the Emergency Room. Will continue until cultures are back.  4.  Weakness: The patient is 47 and lives alone. May end up needing rehabilitation upon discharge. Will get physical therapy evaluation.  5.  Hypertension: Continue Norvasc.  6.  Depression: Continue Zoloft.  7. Paroxysmal atrial fibrillation: On aspirin only for anticoagulation. The patient is rate-controlled.  8.  Chronic low back pain: The patient takes Tylenol and p.r.n. tramadol.  9.  Acute rib pain, probably from fall: Will get an x-ray to rule out fracture.  10.  The patient is a do not resuscitate.   Time spent on  admission: Fifty-five minutes.    ____________________________ Herschell Dimes. Renae Gloss, MD rjw:dm D: 11/28/2012 09:41:00 ET T: 11/28/2012 10:27:42 ET JOB#: [id number]  cc: Jillene Bucks. Arlana Pouch, MD Herschell Dimes. Renae Gloss, MD, <Dictator>   Salley Scarlet MD ELECTRONICALLY SIGNED 12/02/2012 18:58

## 2015-01-12 NOTE — Discharge Summary (Signed)
PATIENT NAME:  Debbie House, Debbie House MR#:  [id number] DATE OF BIRTH:  20-Dec-1913  DATE OF ADMISSION:  11/28/2012 DATE OF DISCHARGE:  12/01/2012  PRIMARY CARE PHYSICIAN:  Dr. Dewaine Oats.   FINAL DIAGNOSES: 1.  Encephalopathy and delirium.  2.  Acute renal failure.  3.  Urinary tract infection.  4.  Rib fracture.  5.  Hypokalemia and hypomagnesemia.  6.  Weakness.  7.  Hypertension.  8.  Depression.  9.  Paroxysmal atrial fibrillation.  10.  Chronic low back pain.   MEDICATIONS ON DISCHARGE:  Include aspirin 81 mg daily, amlodipine 5 mg daily, sertraline 25 mg 3 times a week on Monday, Wednesday and Friday, One-A-Day 50+ multiple vitamin, oral tablet, 1 tablet daily; calcium carbonate 1 tablet twice a day, acetaminophen 325 mg, 2 tablets every 4 hours as needed for pain or temperature; try to use this first for rib fracture; tramadol 50 mg every 6 hours as needed for severe pain; try not to use this as much, Seroquel 12.5 mg at bedtime and cephalexin 250 mg every 8 hours for 4 more days.   DIET: Low-sodium diet, regular consistency.   ACTIVITY: As tolerated.   FOLLOWUP: With physical therapy in 1 to 2 days; with doctor at rehab.   HOSPITAL COURSE: The patient was admitted 11/28/2012, discharged 12/01/2012. Came in initially with altered mental status.   HISTORY OF PRESENT ILLNESS: A 81 year old female who lives by herself usually walks with a walker. She had a fall on Friday and complained of left-sided pain with movement, 10/10 in intensity. She has been taking Tylenol for it. She has been unable to get up and move around as she normally does and was talking out of her head. In the ER, she was brought in with encephalopathy, likely due to a urinary tract infection; acute renal failure, weakness. She was given IV fluid hydration and IV Rocephin.   LABORATORY AND RADIOLOGICAL DATA: During the hospital course included a urine culture that grew out a gram-positive rod. In speaking with  microbiology, this is usually a contaminant. Urinalysis: 2+ bacteria, 2+ leukocyte esterase. CPK 244. Troponin negative. Glucose 110, BUN 24, creatinine 1.18, sodium 142, potassium 4.0, chloride 105, CO2 of 28, calcium 10.2. Liver function tests, normal range. White blood cell count 10.4, H and H 13.5 and 39.9, platelet count of 238. CT scan of the head showed changes of atrophy and chronic microvascular ischemic disease. Blood cultures negative. Left ribs showed a fracture of the left 9th rib; could be potentially a fracture of the left 10th rib also. Potassium on the 10th down to 3.4.  EKG showed atrial fibrillation, left axis deviation. Magnesium on the 11th was 1.4. Potassium on the 11th was 3.1. Creatinine on the 11th was 1.06. Magnesium and potassium on the 12th were 2.0 and 4.5 respectively; in the normal range.   HOSPITAL COURSE:  Per problem list:   1.  For the patient's encephalopathy and delirium, this was likely caused by a urinary tract infection:  The delirium was worse the next day after admission secondary to a poor night's sleep, probably secondary to pain. Seroquel was added. The patient's mental status is back to baseline as per family.  2.  Acute renal failure: The patient was given IV fluid hydration. Creatinine improved down to 1.06 upon discharge.  3.  Urinary tract infection: Culture actually grew out a contaminant. Since she was altered when she first came in, I figured I would finish up with a treatment course.  Rocephin was switched over to Keflex upon discharge for a few more days.  4.  Rib fracture: The patient is at high risk for pneumonia. Was watched here. Respiratory status is stable currently. Lungs are clear. Pain control starting off with Tylenol, but Ultram also used as needed for pain.  5. Hypokalemia and hypomagnesemia: This was replaced IV and orally during the hospitalization. Normal upon discharge. Will discharge without any supplements.  6.  Weakness: She worked  here with physical therapy. Is being transferred to rehab today.  7.  Hypertension: She is on Norvasc.  8.  Depression: On Zoloft.  9.  Paroxysmal atrial fibrillation: She is rate-controlled.  Aspirin only for anticoagulation.  10.  Chronic low back pain: Tylenol as needed.         TIME SPENT ON DISCHARGE: 35 minutes.     ____________________________ Herschell Dimesichard J. Renae GlossWieting, MD rjw:dm D: 12/01/2012 09:47:00 ET T: 12/01/2012 10:35:25 ET JOB#: [id number]  cc: Katherina Rightenny C. Arlana Pouchate, MD Herschell Dimesichard J. Renae GlossWieting, MD, <Dictator> Rehab    Salley ScarletICHARD J WIETING MD ELECTRONICALLY SIGNED 12/02/2012 18:58

## 2015-01-12 NOTE — Discharge Summary (Signed)
PATIENT NAME:  Debbie House, Debbie House MR#:  [id number] DATE OF BIRTH:  05-07-1914  DATE OF ADMISSION:  08/14/2013 DATE OF DISCHARGE:  08/17/2013  ADMISSION DIAGNOSIS: Confusion with slurred speech.   DISCHARGE DIAGNOSES:  1.  Confusion secondary to constipation, less likely transient ischemic attack.  2.  Abdominal distention from constipation.  3.  Glaucoma.  4.  Hypertension.   CONSULTATIONS: None.   LABORATORIES AT DISCHARGE: LDL 45, cholesterol 111, triglycerides 110, HDL 44, VLDL 22. Echocardiogram ejection fraction 55% to 60% with mild left ventricular hypertrophy, mild to moderate mitral valve regurgitation and mild to moderate TR.   Carotid Dopplers show mild plaque at the left carotid bifurcation, estimated left internal carotid artery less than 50%.   HOSPITAL COURSE: A 81 year old, very pleasant female, who was recently treated for pneumonia, who presented with some slurred speech and confusion. She was thought to have constipation as the etiology. For further details, please refer to the H and P. 1.  Slurred speech with confusion. It appears that this may be from constipation as her mental status cleared up as her constipation resolved. She did undergo a TIA workup just including carotid Dopplers and echo. The results are as stated above. The son notes that her symptoms did improve with treatment for constipation, so this is more likely the diagnosis. Her weakness is more generalized. There is no other focality to suggest a neurological event. In any case, she was transitioned from aspirin to Aggrenox by the admitting physician, which we kept. She underwent PT evaluation that recommended rehab for her generalized weakness.  2.  Abdominal distention secondary to constipation, which improved with bowel regimen.  3.  Hypertension.  4.  Glaucoma. The patient will continue her eye drops.   DISCHARGE MEDICATIONS:  1.  Aggrenox 1 tablet p.o. b.i.d.  2.  Norvasc 5 mg daily.  3.  Zoloft  25 mg 3 times a week Monday, Wednesday, Friday.  4.  One-A-Day multivitamin daily.  5.  Calcium carbonate 1 tablet b.i.d.  6.  Tylenol 325, 2 tablets every 4 hours needed as needed for pain.  7.  Tramadol 50 mg q.6 hours p.r.n.  8.  Combigan 0.2 to 0.5 ophthalmic solution 1 drop b.i.d.  9.  Lumigan 0.01% at bedtime.  10. Docusate 1 tablet b.i.d.  11. Polyethylene glycol 17 grams daily.   DISCHARGE DIET: Low sodium.   DISCHARGE ACTIVITY: As tolerated.   DISCHARGE REFERRAL: Physical therapy. The patient is medically stable for discharge to rehab facility.   TIME SPENT: 35 minutes.   ____________________________ Janyth ContesSital P. Juliene PinaMody, MD spm:aw D: 08/17/2013 09:19:53 ET T: 08/17/2013 09:34:44 ET JOB#: [id number]  cc: Quintasia Theroux P. Juliene PinaMody, MD, <Dictator> Jillene Bucksenny C. Arlana Pouchate, MD Janyth ContesSITAL P Curley Fayette MD ELECTRONICALLY SIGNED 08/17/2013 21:23

## 2015-01-12 NOTE — Discharge Summary (Signed)
PATIENT NAME:  Debbie House, Shawntavia M MR#:  [id number] DATE OF BIRTH:  13-Apr-1914  DATE OF ADMISSION:  09/11/2013 DATE OF DISCHARGE:  09/13/2013  PRIMARY CARE PHYSICIAN:  Dr. Arlana Pouchate  DISCHARGE DIAGNOSIS: Urinary tract infection, encephalopathy, hypertension, dehydration.   CONDITION: Stable.   CODE STATUS: DO NOT RESUSCITATE.   [facility] MEDICATIONS: [facility].   DIET: Low sodium diet.   ACTIVITY: As tolerated.   FOLLOWUP CARE: Follow up with PCP within 1 to 2 weeks.   REASON FOR ADMISSION: Weakness.   HOSPITAL COURSE: The patient is a 81 year old Caucasian female, lives at home, comes with history of hypertension, depression, osteoarthritis, presented to the ED with weakness. The patient was noted to have a UTI, was treated with antibiotics in the ED and admitted to the hospital. For detailed history and physical examination, please refer to the admission note dictated by Dr. Randol KernElgergawy.   On admission day, the patient's laboratory data showed glucose 149, BUN 23, creatinine 1.17. Electrolytes were normal. WBC 8.7, hemoglobin 14.1. Urinalysis showed UTI.   HOSPITAL COURSE: 1.  Urinary tract infection. After admission, the patient has been treated with Rocephin.  Urine culture showed gram-negative rods. Blood culture was negative.  2.  Acute encephalopathy. The patient was confused and not following commands for the past 2 days, but the patient's mental status has much improved. She is alert, awake and answers questions, follows commands.  3.  Hypertension. Has been controlled with Norvasc.  4.  Dehydration. The patient's dehydration has been treated with IV fluid support, has improved.  The patient has no complaints. Vital signs are stable. She is clinically stable and will be discharged to skilled nursing facility today. The patient underwent physical therapy due to weakness. Physical therapy suggests that the patient  needs skilled nursing facility. I discussed the patient's discharge plan with the patient' son, Child psychotherapistsocial worker and case Production designer, theatre/television/filmmanager.   TIME SPENT: About 35 minutes.   ____________________________ Shaune PollackQing Sevastian Witczak, MD qc:dmm D: 09/13/2013 09:26:46 ET T: 09/13/2013 09:35:38 ET JOB#: [id number]  cc: Shaune PollackQing Landon Bassford, MD, <Dictator> Shaune PollackQING Gevin Perea MD ELECTRONICALLY SIGNED 09/13/2013 17:05

## 2015-01-12 NOTE — H&P (Signed)
PATIENT NAME:  Debbie House, Debbie House MR#:  [id number] DATE OF BIRTH:  April 09, 1914  DATE OF ADMISSION:  09/11/2013  REFERRING PHYSICIAN:  Dr. Chiquita Loth.   PRIMARY CARE PHYSICIAN: Dr. Dewaine Oats.   CHIEF COMPLAINT: Weakness.   HISTORY OF PRESENT ILLNESS: This is a 81 year old female who lives at home, known history of hypertension, osteoarthritis, glaucoma, depression, paroxysmal atrial fibrillation. The patient presents with weakness. The patient had home health aide almost 12 hours every day. The patient usually ambulates with a walker, usually she is awake, alert x 3. The patient has been feeling weak today with decreased appetite. The patient was walking with the walker with assistance of her age, where she felt very weak, she almost fell, but she was helped to the floor by her aide. No fall, no trauma, no loss of consciousness. Son, at bedside, gives most of the history. The patient was afebrile, did not have any leukocytosis, but her workup came back significant for UTI. The patient was hospitalized in November of this year due to altered mental status, which was found due to constipation. The patient had KUB which did not show any evidence of constipation, as well the patient's health aide reports she had good bowel movement before two days and she had a large bowel movement in the ED as well. Due to the patient's significant weakness and clinical dehydration, hospitalists were requested to admit for further treatment and IV antibiotics. The patient received one dose of IV Rocephin in ED.   PAST MEDICAL HISTORY: 1.  Hypertension.  2.  Mild depression.  3.  History of meningioma. 4.  Chronic lower back pain.  5.  Paroxysmal atrial fibrillation.   6.  Glaucoma.   PAST SURGICAL HISTORY:  1.  Left knee replacement.  2.  Varicose vein surgery.  3.  Cataract surgery.   ALLERGIES:  SULFA.    HOME MEDICATIONS: 1.  Tylenol as needed.  2.  Tramadol 50 mg every 6 hours as needed.  3.  Aspirin 81  mg oral daily.  4.  Sertraline 25 mg oral daily.  5.  Norvasc 5 mg oral daily.  6.  Docusate/senna 50/8.6, 1 tablet 2 times a day.  7.  MiraLAX 17 mg oral daily.  8.  Combigan 1 drop each eye 2 times a day.  9.  Lumigan 0.01% ophthalmic solution, 1 drop each eye daily.  10.  Multivitamin 1 tablet oral daily.   SOCIAL HISTORY: Does not smoke, does not drink, currently lives at home, son lives across from her. She is having CNA help during the day.   FAMILY HISTORY: No history of coronary artery disease at young age in the family.   REVIEW OF SYSTEMS: CONSTITUTIONAL: The patient is mildly confused,  review of systems is limited to that. As well, she is extremely hard of hearing.  GENERAL: No fever, no chills, generalized weakness, fatigue, loss of appetite. No weight gain, no weight loss.  EYES: No blurry vision, no double vision. No pain. Has history of glaucoma and cataracts.  ENT: No tinnitus, no ear pain, hard of hearing, wearing hearing aid. No difficulty swallowing, no epistaxis.  RESPIRATORY: No cough, no wheezing. No hemoptysis. No COPD.  CARDIOVASCULAR: Denies chest pain. Denies edema. No syncope. No near-syncope; that episode was due to weakness. No altered mental status.  GASTROINTESTINAL: No nausea, no vomiting, no diarrhea. No abdominal pain, no constipation. GENITOURINARY: No dysuria, hematuria, no hematuria. No renal colic.  ENDOCRINE: No polyuria. No polydipsia. No heat  or cold intolerance.  HEMATOLOGY: No anemia, no easy bruising no bleeding.  SKIN: No acne. No rash.  MUSCULOSKELETAL: Complains of chronic lower back pain.    NEUROLOGIC: No tingling. No numbness. Has generalized weakness. No focal deficits. No seizures.  PSYCHIATRIC: No anxiety,  there is mild depression and insomnia.   PHYSICAL EXAMINATION: VITAL SIGNS: Temperature 97.7, pulse 76, respiratory rate 18, blood pressure 151/70, saturating 98% on room air.  GENERAL: Frail, elderly female who looks  comfortable in bed, in no apparent distress.  HEENT: Head atraumatic, normocephalic. Pupils equal, reactive to light. Pink conjunctivae. Anicteric sclerae. Dry oral mucosa.  NECK: Supple. No thyromegaly. No JVD.  CHEST: Good air entry bilaterally. No wheezing, rales, rhonchi.  CARDIOVASCULAR: S1, S2 heard. No rubs, murmurs or gallops.  ABDOMEN: Soft, nontender, nondistended. Bowel sounds present.  EXTREMITIES: No edema. No clubbing. No cyanosis.  PSYCHIATRIC: The patient is awake, alert, oriented x 2, confused, but pleasant.  NEUROLOGIC: Cranial nerves grossly intact. Motor: Moves all extremities without significant deficit.  SKIN: Delayed skin turgor, dry.  LYMPHATIC: No cervical lymphadenopathy.  MUSCULOSKELETAL: No joint effusion or erythema.   PERTINENT LABORATORY DATA: Glucose 149, BUN 23, creatinine 1.17, sodium 139, potassium 4.2, chloride 102. Troponin 0.03. White blood cell 8.7, hemoglobin 14.1, hematocrit 42.6, platelets 257.   IMAGING: Abdomen 3- ways showing COPD without superimposed acute cardiopulmonary process, nonspecific bowel gas pattern.   ASSESSMENT AND PLAN: 1.  Weakness, lethargy. This is most like related to her urinary tract infection. We will treat her urinary tract infection. Will start her on gentle hydration. We will consult physical therapy.  2.  Urinary tract infection. The patient will be started on Rocephin. We will follow on the urine culture. 3.  Hypertension. Blood pressure is elevated. We will resume her on Norvasc.  4.  History of depression. We will continue her Zoloft.  5.  Chronic lower back pain. We will have her on p.r.n. Tylenol and tramadol.  6.  History of glaucoma. We will continue her home eyedrops; Lumigan and Combigan.  7.  History of paroxysmal atrial fibrillation. Her EKG currently showing atrial fibrillation at 88 beats per minute. She is on aspirin. Rate is controlled. The patient is high risk for anticoagulation due to fall. She is on  aspirin.   8.  Deep vein thrombosis prophylaxis. Subcutaneous heparin.  9.  CODE STATUS: Discussed with son. The patient has a LIVING WILL. She is DO NOT RESUSCITATE.   Total time spent on admission and patient care: 55 minutes.    ____________________________ Starleen Armsawood S. Saramarie Stinger, MD dse:NTS D: 09/11/2013 02:01:20 ET T: 09/11/2013 03:38:07 ET JOB#: [id number]  cc: Starleen Armsawood S. Darryon Bastin, MD, <Dictator> Bufford Helms Teena IraniS Kennon Encinas MD ELECTRONICALLY SIGNED 09/14/2013 5:41

## 2015-01-12 NOTE — H&P (Signed)
PATIENT NAME:  Debbie House, SISON MR#:  [id number] DATE OF BIRTH:  July 25, 1914  DATE OF ADMISSION:  08/14/2013  PRIMARY CARE PROVIDER: Dr. Dewaine Oats.   ED REFERRING PHYSICIAN: Dr. Dorothea Glassman.    CHIEF COMPLAINT: Slurred speech, some difficulty with ambulation, loss of brief consciousness.   HISTORY OF PRESENT ILLNESS: The patient is a 81 year old white female with previous history of hypertension, history of osteoarthritis, glaucoma, depression, paroxysmal A. fib, chronic low back pain who lives at home with a nurse, aide and CNA during the day and then stays by herself at night. According to her aide, the patient has had some confusion over the past few days. Earlier today, when she went to wake her up, the patient was staring blankly and it was hard to get her out of the bed. When she was able to manage to get her out of the bed, the patient started having slurred speech and was leaning more towards the left side and she was not responding. Then she started coming back to her baseline. The patient was brought to the ED. CT scan of the head showed an old stroke. Currently, the patient is close to her baseline. She also has had problem with abdominal distention and has had problem with constipation. The patient has not had any complaint of any chest pains or palpitations. Has not had any urinary symptoms including frequency, urgency or hesitation. She denies any abdominal pain, nausea, vomiting or diarrhea but according to her aide she has been constipated.   PAST MEDICAL HISTORY: Significant for hypertension, mild depression, history of meningioma, chronic low back pain, paroxysmal A. fib.    PAST SURGICAL HISTORY: Status post left knee replacement, varicose vein surgery and cataracts.   ALLERGIES: SULFA.   CURRENT MEDICATIONS AT HOME: Tylenol 650 q.4 p.r.n., amlodipine 5 daily, aspirin 81 mg 1 tab p.o. daily, calcium 1 tab p.o. b.i.d., Combigan 0.2% to 0.5% one drop to each eye b.i.d., Lumigan  0.001% one drop to each eye at bedtime once a day, multivitamin daily, sertraline 25 mg 1 tab 3 times a week on Monday, Wednesday, Friday; tramadol 50 q.6.   SOCIAL HISTORY: Does not smoke. Does not drink. Currently resides at home. Her son lives across from. Has during the day CNA and aides.    REVIEW OF SYSTEMS: Limited due to patient's mild dementia.  CONSTITUTIONAL: Denies any fevers. Complains of fatigue, weakness. No pain. No weight loss. No weight gain.  EYES: No blurred or double vision. No pain. No redness. No inflammation. Has history of glaucoma and cataracts.  ENT: No tinnitus. No ear pain. Hard of hearing. No seasonal or year-round allergies. No epistaxis. No difficulty swallowing.  RESPIRATORY: Denies any cough, wheezing, hemoptysis. No COPD.   CARDIOVASCULAR: Denies chest pain, no orthopnea. No edema. Has history of paroxysmal A. fib.  No syncope.  GASTROINTESTINAL: No nausea, vomiting, diarrhea. Complains of abdominal distention and constipation.  GENITOURINARY: Denies any dysuria, hematuria, renal calc or frequency.  ENDOCRINE: Denies any polydrug, nocturia or thyroid problems. HEMATOLOGIC AND LYMPHATIC: Denies any anemia, easy bruisability or bleeding.  SKIN: No acne. No rash. No changes in mole, hair or skin.  MUSCULOSKELETAL: Has chronic low back pain.  NEUROLOGICAL: No numbness. Has chronic weakness. No previous history of CVA or TIA.  No seizures.  PSYCHIATRIC: No anxiety.  There is some depression.   PHYSICAL EXAMINATION: VITAL SIGNS: Temperature 97.8, pulse 100, respirations 20, blood pressure 177/78.  GENERAL: The patient is an elderly female in no  acute distress.  HEENT: Head atraumatic, normocephalic. Pupils equally round, reactive to light and accommodation. There is no conjunctival pallor. No scleral icterus. Nasal exam shows no drainage or ulceration. Oropharynx is clear without any exudate.  NECK: Supple without any JVD.  CARDIOVASCULAR: Regular rate and rhythm.  No murmurs, rubs, clicks or gallops. PMI is not displaced.  LUNGS: Clear to auscultation bilaterally without any rales, rhonchi, wheezing.  ABDOMEN: Distended with normal bowel sounds. There is no guarding or rebound. No tenderness, no hepatosplenomegaly.  EXTREMITIES: No clubbing, cyanosis or edema.  SKIN: No rash.  LYMPHATICS: No lymph nodes palpable.  VASCULAR: Good DP, PT pulses.  NEUROLOGICAL: Cranial nerves II through XII grossly intact. Strength is diminished globally due to generalized weakness. Reflexes 2+.  PSYCHIATRIC: Not anxious or depressed.   LABORATORY AND RADIOLOGICAL DATA: CT scan of the head shows atrophy with chronic microvascular changes, old left thalamic lacunar infarct. Glucose 134, BUN 28, creatinine 1.20, sodium 139, potassium 3.9, chloride 103, CO2 is 33, calcium 9.9. LFTs are normal. Troponin less than 0.02. TSH 2.69. WBC 8.3, hemoglobin 13.2, platelet count 193.   ASSESSMENT AND PLAN: The patient is a 81 year old white female who presented with brief episode of slurred speech, decrease in responsiveness with some right-sided weakness, now improved.  1.  Possible transient ischemic attack. At this time, we will check carotid Dopplers and echocardiogram. The patient was already on aspirin. I will change her to Aggrenox. We will check a fasting lipid panel in the a.m.  Have PT evaluate the patient.  2.  Abdominal distention, possibly could be due to gas or constipation. We will check abdominal x-ray.  3.  Hypertension. Continue amlodipine.  4.  Glaucoma. We will continue her eye drops as taking at home.  5.  Miscellaneous. We will place her on Lovenox for deep vein thrombosis prophylaxis.   TIME SPENT: 50 minutes spent on this patient.     ____________________________ Lacie ScottsShreyang H. Allena KatzPatel, MD shp:cs D: 08/14/2013 14:44:13 ET T: 08/14/2013 20:12:40 ET JOB#: [id number]  cc: Katura Eatherly H. Allena KatzPatel, MD, <Dictator> Charise CarwinSHREYANG H Arcenia Scarbro MD ELECTRONICALLY SIGNED 08/14/2013 21:01

## 2015-01-13 NOTE — Discharge Summary (Signed)
PATIENT NAME:  Debbie HarrowWILLIAMS, Debbie House MR#:  [id number] DATE OF BIRTH:  08/07/1914  DATE OF ADMISSION:  09/27/2013 DATE OF DISCHARGE:  10/01/2013  ADMISSION DIAGNOSIS:  Acute encephalopathy.   DISCHARGE DIAGNOSES: Acute encephalopathy, multifactorial secondary to Escherichia coli urinary tract infection as well as subacute stroke and delirium.    CONSULTATIONS:  1.  Dr.  Lonna CobbStoioff. 2.  Palliative care.  3.  Dr. Jeryl ColumbiaHendry from Neurology.   LABORATORIES AT DISCHARGE: Blood cultures negative to date. Urine culture was negative; however urine culture from previous ER visit was positive for enterococcus and it was sensitive to Macrobid, ampicillin and linezolid. Sodium 141, potassium 3.2, chloride 107, bicarbonate 26, BUN 10, creatinine 1.02. Platelets 256.   Head CT performed on September 30, 2012 showed subacute CVA in the right PCA territory.   Kidney ultrasound showed a 16 mm right renal cyst and bilateral renal cortical thickening.   HOSPITAL COURSE: A 81 year old female with a history of paroxysmal atrial fibrillation, on aspirin who presented with confusion and encephalopathy. For further details, please refer to the H and P.   1.  Acute encephalopathy thought to be secondary to urinary tract infection. The patient had been treated as an outpatient for urinary tract infection. However, she was not on the correct antibiotics as per sensitivity. She was brought here for further evaluation. We recognized that she had been treated with the incorrect antibiotics. Here she was treated with the current antibiotics. Her confusion persisted, so we obtained a CT scan of the head. This showed a subacute CVA. We consulted Neurology. Neurology did see the patient and they felt that her mental status was secondary to UTI and stroke. He did discussed with the family that there was an expectation of prolonged recovery and possible incomplete restoration of her full cognitive capabilities. He did recommend a statin and  aspirin, which she had already been placed on. Family was comfortable with this plan and discharge to a skilled nursing facility.  2.  Urinary tract infection which was positive for enterococcus on urine culture from January 1. Her renal ultrasound did show a nonspecific cyst. Urology was consulted as per the request of the family.  Dr. Lonna CobbStoioff recommended treating her for urinary tract infection and if further issues arise, the patient can be seen as an outpatient for outpatient cystoscopy.  3.  Dehydration, resolved with IV fluids.  4.  Mild depression. The patient continued on Zoloft.  5.  Hypertension. The patient continued on outpatient medications.  6.  Subacute CVA as already dictated. The patient was seen by Neurology. They recommended aspirin and Zocor. No further testing at this time.   MEDICATIONS:  1.  Zoloft 25 mg Monday, Wednesday, Friday.  2.  One-A-Day vitamin daily.  3.  Acetaminophen 325, two tablets q. 4 hours p.r.n. pain.  4.  Combigan 1 drop  b.i.d.  5.  Lumigan eyedrops one drop at bedtime.  6.  Aspirin 81 mg daily. 7.  Tramadol 25 mg q. 6 hours p.r.n. pain.  8.  Simvastatin 10 mg daily.  9.  Augmentin 875 mg b.i.d. x 9 days.  10.  Macrobid 100 mg b.i.d. after Augmentin is completed.  DISCHARGE DIET: Low fat, low cholesterol puree diet.   DISCHARGE ACTIVITY: As tolerated.   DISCHARGE REFERRAL: PT, OT, speech.  The patient will follow up with Dr. Dewaine Oatsenny Tate in one week. The patient may follow-up Dr. Lonna CobbStoioff on as-needed basis.    TIME SPENT: Approximately 40 minutes. The patient is medically  stable for discharge.    ____________________________ Kesa Birky P. Juliene Pina, MD spm:dp D: 10/01/2013 13:39:30 ET T: 10/01/2013 14:06:04 ET JOB#: [id number]  cc: Brysun Eschmann P. Juliene Pina, MD, <Dictator> Jillene Bucks. Arlana Pouch, MD Verna Czech. Lonna Cobb, MD Janyth Contes Oluwaseyi Raffel MD ELECTRONICALLY SIGNED 10/03/2013 14:29

## 2015-01-13 NOTE — Consult Note (Signed)
PATIENT NAME:  Debbie House, Debbie House MR#:  [id number] DATE OF BIRTH:  Feb 29, 1914  DATE OF CONSULTATION:  09/29/2013  REFERRING PHYSICIAN:  Shreyang H. Allena KatzPatel, MD CONSULTING PHYSICIAN:  Shevonne Wolf C. Lonna CobbStoioff, MD  REASON FOR CONSULTATION: The patient's son insist on neurology input for mother's recurrent urinary tract infections.   HISTORY OF PRESENT ILLNESS: A 81 year old female who was admitted in late December for an urinary tract infection. Urine culture during that hospitalization grew 1000 colonies of Proteus which was treated with Rocephin. She was discharged to a rehab facility on 09/13/2013 and during that stay developed worsening weakness and confusion. She was seen in the Emergency Department on 01/01 where urinalysis did show pyuria. She was started on Cipro and an urine culture was ordered. The patient continued to have increasing confusion and somnolence with decreased p.o. intake. Her urine culture did grow Enterococcus faecalis which was resistant to ciprofloxacin. There is no history of fever. There were no family members present at the time of this visit and the patient was unable to give an accurate history. She is currently on Unasyn and ceftriaxone. She has had no renal imaging in the past three years at [facility]. It is unclear if she has had any previous urologic evaluation.   PAST MEDICAL HISTORY: 1. Hypertension.  2. Depression.  3. History of meningioma.  4. Paroxysmal atrial fibrillation.  5. Glaucoma.   PAST SURGICAL HISTORY: 1. Left total knee replacement.  2. Varicose vein surgery.  3. Cataract extraction.   ALLERGIES: SULFA.   SOCIAL HISTORY: No tobacco or alcohol use. She was [facility] in [facility].  ADMISSION MEDICATIONS: Tramadol 50 mg every six hours p.r.n. pain, sertraline 25 mg 3 times weekly MiraLAX 17 grams daily. Cipro 500 mg b.i.d. Senokot-S b.i.d. ASA 81 mg daily, amlodipine 10 mg daily. Acetaminophen 325 mg 2 tablets q.4 hours p.r.n.  pain  REVIEW OF SYSTEMS: Not obtainable.   PHYSICAL EXAMINATION: VITAL SIGNS: Temperature 97.8, blood pressure 151/70.  GENERAL: The patient is somnolent in no acute distress.  ABDOMEN: Soft and nontender. Bladder not palpable or percussible.   DATA: Admission urine culture greater than 100,000 colonies of Enterococcus faecalis which is sensitive to ampicillin, resistant to Cipro. Blood cultures are negative. Creatinine 1.17. WBC this morning 8.7, on admission was 11.8.   IMPRESSION: Urinary tract infection with recent worsening secondary to organism resistant to Cipro.   RECOMMENDATION: 1. Renal ultrasound for upper tract screening.  2. Follow-up with me after discharge for recheck of urinalysis and symptoms.  3. Outpatient cystoscopy for persistent urinary tract infection.    ____________________________ Verna CzechScott C. Lonna CobbStoioff, MD scs:sg D: 09/29/2013 08:53:47 ET T: 09/29/2013 09:20:33 ET JOB#: [id number]  cc: Lorin PicketScott C. Lonna CobbStoioff, MD, <Dictator> Riki AltesSCOTT C Yoselin Amerman MD ELECTRONICALLY SIGNED 10/05/2013 8:31

## 2015-01-13 NOTE — Consult Note (Signed)
Referring Physician:  Vaughan Basta :   Primary Care Physician:  Albina Billet : [address] [place], [place] [address], [phone]  Reason for Consult: Admit Date: 27-Sep-2013  Reason for Consult: altered mental status; CVA   History of Present Illness: History of Present Illness:   Ms. Hach is a 81 yo woman with a history of HTN who presented to [facility] on 1/6 for evaluation of altered mental status. The history is gathered from the patient's children. She recently developed generalized weakness and mild confusion last month and was treated for a UTI with oral ciprofloxacin. Normally, she lives alone, but was discharged from the UTI hospitalization to [facility] due to generalized weakness. While there, her son recounts that she developed intermittent periods of lethargy intermixed with relative states of hyperarousal. In the several days prior to this admission, she was persistently more drowsy and sleepy, and so she was taken back to [facility] for further evaluation. Here, it was found that her urine culture from December showed E. coli resistant to ciprofloxacin, and her antiobiotics were changed. As part of the workup for her persistent altered mental status, a noncontrast head CT was obtained. This showed a late subacute right temporo-occipital infarct.   ROS:  Review of Systems   Unable to obtain due to altered mental status  Past Medical/Surgical Hx:  right hip pain with ambulation:   meningioma: per chart notes  pneumonia: per chart notes  Depression:   Arthritis:   HTN:   Glaucoma:   Left Knee:   Home Medications: Medication Instructions Last Modified Date/Time  Macrobid macrocrystals-monohydrate 100 mg oral capsule 1 cap(s) orally 2 times a day 06-Jan-15 09:54  amLODIPine 10 mg oral tablet 1 tab(s) orally once a day 06-Jan-15 09:54  docusate-senna 50 mg-8.6 mg oral tablet 1 tab(s) orally 2 times a day 06-Jan-15 09:54  polyethylene glycol 3350 oral powder  for reconstitution 17 gram(s) orally once a day 06-Jan-15 09:54  acetaminophen 325 mg oral tablet 2 tabs 9685m) orally every 4 hours as needed, pain or temp. greater than 100.4 06-Jan-15 09:54  Combigan 0.2%-0.5% ophthalmic solution 1 drop(s) in each eye 2 times a day 06-Jan-15 09:54  Lumigan 0.01% ophthalmic solution 1 drop(s) into each eye once a day (at bedtime) 06-Jan-15 09:54  aspirin 81 mg oral tablet 1 tab(s) orally once a day 06-Jan-15 09:54  traMADol 50 mg oral tablet 1 tab(s) orally every 6 hours, As Needed - for Pain 06-Jan-15 09:54  sertraline 25 mg oral tablet 1 tab(s) orally 3 times a week on Monday, Wednesday, and Friday. 06-Jan-15 09:54  One-A-Day 50+ Multiple Vitamins oral tablet 1 tab(s) orally once a day 06-Jan-15 09:54   Allergies:  Sulfa: GI Distress  Social/Family History: Lives With: alone  Social History: Family denies that the patient smokes, drinks EtOH, or uses illicit drugs.  Family History: Early coronary disease in the family. No known FH of stroke.   Vital Signs: **Vital Signs.:   10-Jan-15 09:41  Vital Signs Type Routine  Temperature Temperature (F) 98.2  Celsius 36.7  Temperature Source axillary  Pulse Pulse 67  Respirations Respirations 20  Systolic BP Systolic BP 1092 Diastolic BP (mmHg) Diastolic BP (mmHg) 64  Mean BP 96  Pulse Ox % Pulse Ox % 94  Pulse Ox Activity Level  At rest  Oxygen Delivery Room Air/ 21 %   EXAM: Well-developed, well-nourished, in NAD. No conjunctival injection or scleral edema. Oropharynx clear. No carotid bruits auscultated. Normal S1, S2  and regular cardiac rhythm on exam. Lungs clear to auscultation bilaterally. Abdomen soft and nontender. Peripheral pulses palpated. No clubbing, cyanosis, or edema in extremities.  MENTAL STATUS: Alert and oriented to person only. Language fluent and appropriate. Repetition and naming are intact. She follows commands appropriately. CRANIAL NERVES: She is only intermittently compliant  with the visual field exam, though consistently misses counting fingers in the left upper quadrant. PERRL. EOMI. Facial sensation intact. Facial muscles full and symmetric. Hearing intact to finger rub. Uvula midline with symmetric palatal elevation. Tongue midline without fasciculations. MOTOR: Normal bulk and tone. Strength 5/5 in deltoids, biceps, triceps, wrist flexors and extensors, and hand intrinsics bilaterally. Strength 5/5 in iliopsoas, glutes, hamstrings, quads, and tib ant bilaterally. REFLEXES: 2+ in biceps, triceps, patella, and achilles bilaterally. Extensor plantar responses bilaterally. SENSORY: Intact to vibration and temperature throughout. COORDINATION: No ataxia or dysmetria on finger-nose. She declines to perform heel-shin. GAIT: Not evaluated.  Lab Results: Hepatic:  06-Jan-15 09:19   Bilirubin, Total 1.0  Alkaline Phosphatase 106 (45-117 NOTE: New Reference Range 08/12/13)  SGPT (ALT) 28  SGOT (AST)  65  Total Protein, Serum 6.6  Albumin, Serum  3.3  Routine Micro:  06-Jan-15 09:53   Specimen Source CLEAN CATCH    11:09   Micro Text Report BLOOD CULTURE   COMMENT                   NO GROWTH IN 48 HOURS   ANTIBIOTIC                       Culture Comment NO GROWTH IN 48 HOURS  Result(s) reported on 29 Sep 2013 at 11:00AM.  Routine Chem:  06-Jan-15 09:19   Lipase 79 (Result(s) reported on 27 Sep 2013 at 10:16AM.)  09-Jan-15 07:01   Magnesium, Serum  1.4 (1.8-2.4 THERAPEUTIC RANGE: 4-7 mg/dL TOXIC: > 10 mg/dL  -----------------------)  10-Jan-15 04:21   Glucose, Serum 88  BUN 10  Creatinine (comp) 1.02  Sodium, Serum 141  Potassium, Serum  3.2  Chloride, Serum 107  CO2, Serum 26  Calcium (Total), Serum 8.6  Anion Gap 8  Osmolality (calc) 280  eGFR (African American)  53  eGFR (Non-African American)  46 (eGFR values <66m/min/1.73 m2 may be an indication of chronic kidney disease (CKD). Calculated eGFR is useful in patients with stable renal  function. The eGFR calculation will not be reliable in acutely ill patients when serum creatinine is changing rapidly. It is not useful in  patients on dialysis. The eGFR calculation may not be applicable to patients at the low and high extremes of body sizes, pregnant women, and vegetarians.)  Cardiac:  06-Jan-15 09:19   Troponin I < 0.02 (0.00-0.05 0.05 ng/mL or less: NEGATIVE  Repeat testing in 3-6 hrs  if clinically indicated. >0.05 ng/mL: POTENTIAL  MYOCARDIAL INJURY. Repeat  testing in 3-6 hrs if  clinically indicated. NOTE: An increase or decrease  of 30% or more on serial  testing suggests a  clinically important change)  Routine UA:  06-Jan-15 09:53   Color (UA) Yellow  Clarity (UA) Hazy  Glucose (UA) Negative  Bilirubin (UA) Negative  Ketones (UA) 1+  Specific Gravity (UA) 1.010  Blood (UA) 2+  pH (UA) 6.0  Protein (UA) Negative  Nitrite (UA) Negative  Leukocyte Esterase (UA) 3+ (Result(s) reported on 27 Sep 2013 at 10:54AM.)  RBC (UA) 88 /HPF  WBC (UA) 54 /HPF  Bacteria (UA) NONE SEEN  Epithelial Cells (  UA) 12 /HPF  Mucous (UA) PRESENT  Budding Yeast (UA) PRESENT  Hyaline Cast (UA) 3 /LPF (Result(s) reported on 27 Sep 2013 at 10:54AM.)  Routine Hem:  07-Jan-15 07:03   WBC (CBC) 8.7  RBC (CBC)  3.77  Hemoglobin (CBC)  11.7  Hematocrit (CBC)  33.8  MCV 90  MCH 31.0  MCHC 34.6  RDW 13.7  Neutrophil % 83.2  Lymphocyte % 7.4  Monocyte % 3.5  Eosinophil % 4.7  Basophil % 1.2  Neutrophil #  7.2  Lymphocyte #  0.6  Monocyte # 0.3  Eosinophil # 0.4  Basophil # 0.1 (Result(s) reported on 28 Sep 2013 at 08:21AM.)  10-Jan-15 04:21   Platelet Count (CBC) 256 (Result(s) reported on 01 Oct 2013 at [facility].)   Radiology Results: CT:    09-Jan-15 15:39, CT Head Without Contrast  CT Head Without Contrast   REASON FOR EXAM:    cva  COMMENTS:       PROCEDURE: CT  - CT HEAD WITHOUT CONTRAST  - Sep 30 2013  3:39PM     CLINICAL DATA:  CVA.    EXAM:  CT  HEAD WITHOUT CONTRAST    TECHNIQUE:  Contiguous axial images were obtained from the base of the skull  through the vertex without intravenous contrast.    COMPARISON:  09/11/2013  FINDINGS:  There is a relatively well-defined area of predominately  hypoattenuation that extends from the inferomedial right parietal  lobe through the medial right occipital lobe. It has mild mass  effect on the adjacent atrium of the right lateral ventricle and  occipital horn. It measures 4.6 cm x 2.4 cm in greatest transverse  dimension. This is consistent with a late subacute, PCA distribution  infarct.    There are noother convincing recent infarcts. Patchy areas of white  matter hypoattenuation are noted most consistent with moderate to  advanced chronic microvascular ischemic change.    The ventricles are normal in configuration. There is ventricular and  sulcal enlargement reflecting stable atrophy. No hydrocephalus.  A 2 cm densely calcified extra-axial mass lies along the right  posterior falx consistent with a stable meningioma. No other  extra-axial abnormalities.    No intracranial hemorrhage.    Sinuses and mastoid air cells are clear.     IMPRESSION:  1. Late subacute right PCA distribution infarct. No other evidence  of a recent infarct. No intracranial hemorrhage.  2. Age related atrophy, stable. Moderate to advanced chronic  microvascular ischemic change, also stable.  3. Stable 2 cm densely calcified/ossified right parasagittal  meningioma.  Electronically Signed    By: Lajean Manes M.D.    On: 09/30/2013 15:44         Verified By: Lasandra Beech, M.D.,   Impression/Recommendations: Recommendations:   Ms. Cederberg is a 81 yo woman with a documented history of paroxysmal atrial fibrillation (on ASA only, regular rhythm at time of my exam, family knows nothing about this diagnosis) who presented with altered mental status in the context of a UTI. She was also found to have  a right temporo-occipital infarct that looks to be at least a week or two old. Other than her disorientation, her only focal exam finding is a partial left homonymous hemianopia. The etiology of her stroke is unclear, though cardioembolic cause is suspected with the reported history of paroxysmal atrial fibrillation. altered mental status is likely multifactorial and historically is consistent with delirium, likely related to both her stroke and UTI. I  discussed this at length with the family, including my expectation of a prolonged recovery and possible incomplete restoration of her full cognitive capabilities. Continue statin and ASA for secondar stroke risk reductionNo further neuroimaging indicated at this point. Family has said they would opt against any vascular procedure or full-dose anticoagulation, so determining the exact etiology of the stroke would not change secondary prevention other than continuing ASA and statinSNF/rehab placement is indicated for now, as she lives alone and is currently unable to take care of herselfContinue to treat underlying infection which will likely help her delirium eventually you for the opportunity to participate in Ms. Bass' care. Please page neurology with further questions.  Electronic Signatures: Carmin Richmond (MD)  (Signed 10-Jan-15 13:28)  Authored: REFERRING PHYSICIAN, Primary Care Physician, Consult, History of Present Illness, Review of Systems, PAST MEDICAL/SURGICAL HISTORY, HOME MEDICATIONS, ALLERGIES, Social/Family History, NURSING VITAL SIGNS, Physical Exam-, LAB RESULTS, RADIOLOGY RESULTS, Recommendations   Last Updated: 10-Jan-15 13:28 by Carmin Richmond (MD)

## 2015-01-13 NOTE — Consult Note (Signed)
Brief Consult Note: Patient was seen by consultant.   Consult note dictated.   Comments: Recc screening renal u/s.  Electronic Signatures: Riki AltesStoioff, Nyssa Sayegh C (MD)  (Signed 08-Jan-15 08:54)  Authored: Brief Consult Note   Last Updated: 08-Jan-15 08:54 by Riki AltesStoioff, Jaidalyn Schillo C (MD)

## 2015-01-13 NOTE — H&P (Signed)
PATIENT NAME:  Debbie House, Debbie House MR#:  [id number] DATE OF BIRTH:  1914/02/26  DATE OF ADMISSION:  09/27/2013  PRIMARY CARE PHYSICIAN: Dewaine Oats, MD  REFERRING EMERGENCY ROOM PHYSICIAN: Loleta Rose, MD  CHIEF COMPLAINT: Confusion, altered mental status.   HISTORY OF PRESENT ILLNESS: This is a 81 year old female who lives at rehab center since the last admission which was 2 days before Christmas and she was sent to [facility] for weakness related to UTI. She stayed there for 5 to 6 days and she was worsening so they sent her back to Emergency Room on first of January. When she had urinalysis done which was positive she had urine culture collected and she was sent back to [facility] with ciprofloxacin oral. As per the son, who is present in the room and main history giver said that the patient was doing okay but not so good and not improving enough for the last 4 days after taking ciprofloxacin, but then since yesterday she started becoming more and more drowsy and sleepy and not eating and not able to take any oral pills now because of mental status so they called emergency room and on checking the culture result they were reported that her urinary coli are resistant to ciprofloxacin and so the rehab center sent her back to the Emergency Room for further management for UTI. The son denies the patient having any fever or any other symptoms. He said that until yesterday she was doing reasonably okay, able to take her oral medication, but not improving enough overall in her health, but then she is there doing much worse and not anything since yesterday and not taking any medication.   REVIEW OF SYSTEMS: Unable to get it as the patient is not able to give me any history.   PAST MEDICAL HISTORY:  1.  Hypertension.  2.  Mild depression.  3.  Meningioma. 4.  Chronic low back pain.  5.  Paroxysmal atrial fibrillation.  6.  Glaucoma.   PAST SURGICAL HISTORY:  1.  Left knee replacement  surgery. 2.  Varicose vein surgery. 3.  Cataract surgery.   ALLERGIES: SULFA.   SOCIAL HISTORY: Does not smoke, no drink, no alcohol. Currently sent from [facility] and before she had UTI she was living at home with some home health and son lives on the street taking care of her day-to-day activities.   FAMILY HISTORY: Coronary artery disease at young age in the family.   HOME MEDICATIONS: 1.  Tramadol 50 mg oral every 6 hours as needed for pain.  2.  Sertraline 25 mg oral tablet 1 tablet 3 times a week. 3.  Polyethylene glycol 17 grams oral once a day.  4.  Vitamin tablet once a day.  5.  Ciprofloxacin tablets for the last 5 days for UTI. 6.  Docusate and senna combination tablet 2 times a day.  7.  Aspirin 81 mg once a day.  8.  Amlodipine 10 mg once a day.  9.  Acetaminophen 325 mg 2 tablets every 4 hours as needed for pain and temperature.  PHYSICAL EXAMINATION: VITAL SIGNS: In the ER, temperature 98.5, pulse 70, respirations 18, blood pressure 90/66 and pulse ox 96 on room air.  GENERAL: The patient is currently drowsy but easily arousable on touch or stimuli then goes back to sleep. Is speaking a few words, but not very communicative. HEENT: Head and neck atraumatic. Conjunctiva pink. Pupils reactive to light.  NECK: Supple. No JVD.  RESPIRATORY: Bilateral  clear and equal air entry.  CARDIOVASCULAR: S1, S2 present. No murmur.  ABDOMEN: Soft, nontender, nondistended. Bowel sounds present.  EXTREMITIES: No edema. No clubbing or cyanosis.  PSYCHIATRIC: The patient is currently drowsy and so not able to assess.  NEUROLOGIC: Drowsy and altered mental status currently, but easily arousable on stimuli then goes back to sleep and closes eyes. Overall appears generalized weak. Moves all 4 limbs to painful stimuli. No rigidity or tremors.   IMPORTANT LABORATORY RESULTS Glucose 117, BUN 21, creatinine 1.31. Creatinine on 1st January was 1.06. Sodium 136, potassium 4.1, chloride 104,  CO2 27. Magnesium 1.7. Total protein 6.6, albumin 3.3, bilirubin 1, alkaline phosphatase 106, SGOT 65 and SGPT 28. Troponin less than 0.02. WBC 11.8, hemoglobin 13.3, platelet count 220. Urine culture, which was done on 1st of January grew more than 100,000 colony-forming units of Enterococcus faecalis which were resistant to ciprofloxacin. Urinalysis today is positive with 54 WBCs and 88 RBCs with 3+ leukocyte esterase.   ASSESSMENT AND PLAN: A 81 year old female with past medical history of hypertension, mild depression, chronic low back pain, paroxysmal atrial fibrillation and glaucoma who had a urinary tract infection and sent from rehab because her symptoms are getting worse and her urine culture shows bacteria resistant to ciprofloxacin, what she was taking.  1.  Altered mental status. This is most likely due to metabolic encephalopathy due to infection. Will treat the underlying cause and give some IV fluid and will monitor for improvement.  2.  Urinary tract infection. We will give IV ceftriaxone and send for culture. We will adjust antibiotic according to the culture result later on. 3.  Dehydration. There is some worsening in kidney function and creatinine since last week so will give her IV fluid and monitor it later on.  4.  Mild depression. Was taking sertraline. Will continue the same.  5.  Hypertension. Currently blood pressure is on the lower side so will hold blood pressure medication.   CODE STATUS: DO NOT RESUSCITATE as the paper was sent from nursing home with her.   The plan was discussed with the patient's son who is present in the room.   TOTAL TIME SPENT ON THIS ADMISSION: 50 minutes. ____________________________ Hope PigeonVaibhavkumar G. Elisabeth PigeonVachhani, MD vgv:[id number] D: 09/27/2013 13:43:01 ET T: 09/27/2013 14:14:33 ET JOB#: [id number]  cc: Hope PigeonVaibhavkumar G. Elisabeth PigeonVachhani, MD, <Dictator> Jillene Bucksenny C. Arlana Pouchate, MD Altamese DillingVAIBHAVKUMAR Zamirah Denny MD ELECTRONICALLY SIGNED 09/28/2013 22:22
